# Patient Record
Sex: Male | Born: 1955 | Race: Black or African American | Hispanic: No | Marital: Married | Smoking: Never smoker
Health system: Southern US, Community
[De-identification: ages and names within clinical notes are randomized; demographics above are authoritative.]

## PROBLEM LIST (undated history)

## (undated) DIAGNOSIS — I10 Essential (primary) hypertension: Secondary | ICD-10-CM

## (undated) DIAGNOSIS — M1990 Unspecified osteoarthritis, unspecified site: Secondary | ICD-10-CM

## (undated) DIAGNOSIS — E039 Hypothyroidism, unspecified: Secondary | ICD-10-CM

## (undated) DIAGNOSIS — G4730 Sleep apnea, unspecified: Secondary | ICD-10-CM

## (undated) DIAGNOSIS — E0590 Thyrotoxicosis, unspecified without thyrotoxic crisis or storm: Principal | ICD-10-CM

## (undated) HISTORY — PX: APPENDECTOMY: SHX54

## (undated) HISTORY — DX: Thyrotoxicosis, unspecified without thyrotoxic crisis or storm: E05.90

---

## 1999-02-27 ENCOUNTER — Emergency Department (HOSPITAL_COMMUNITY): Admission: EM | Admit: 1999-02-27 | Discharge: 1999-02-27 | Payer: Self-pay | Admitting: Emergency Medicine

## 2003-11-16 ENCOUNTER — Ambulatory Visit (HOSPITAL_COMMUNITY): Admission: RE | Admit: 2003-11-16 | Discharge: 2003-11-16 | Payer: Self-pay | Admitting: *Deleted

## 2005-01-26 ENCOUNTER — Emergency Department (HOSPITAL_COMMUNITY): Admission: EM | Admit: 2005-01-26 | Discharge: 2005-01-26 | Payer: Self-pay | Admitting: Emergency Medicine

## 2007-12-09 ENCOUNTER — Emergency Department (HOSPITAL_COMMUNITY): Admission: EM | Admit: 2007-12-09 | Discharge: 2007-12-09 | Payer: Self-pay | Admitting: Emergency Medicine

## 2008-10-19 ENCOUNTER — Emergency Department (HOSPITAL_COMMUNITY): Admission: EM | Admit: 2008-10-19 | Discharge: 2008-10-20 | Payer: Self-pay | Admitting: Emergency Medicine

## 2010-09-04 LAB — URINE CULTURE: Colony Count: 3000

## 2010-09-04 LAB — POCT CARDIAC MARKERS
CKMB, poc: <1 ng/mL — ABNORMAL LOW (ref 1.0–8.0)
CKMB, poc: <1 ng/mL — ABNORMAL LOW (ref 1.0–8.0)
Myoglobin, poc: 45.2 ng/mL (ref 12–200)
Myoglobin, poc: 51.2 ng/mL (ref 12–200)
Troponin i, poc: <0.05 ng/mL (ref 0.00–0.09)
Troponin i, poc: <0.05 ng/mL (ref 0.00–0.09)

## 2010-09-04 LAB — URINALYSIS, ROUTINE W REFLEX MICROSCOPIC
Bilirubin Urine: NEGATIVE
Glucose, UA: NEGATIVE mg/dL
Leukocytes, UA: NEGATIVE
Nitrite: NEGATIVE
Protein, ur: NEGATIVE mg/dL
Specific Gravity, Urine: 1.031 — ABNORMAL HIGH (ref 1.005–1.030)
Urobilinogen, UA: 1 mg/dL (ref 0.0–1.0)
pH: 6 (ref 5.0–8.0)

## 2010-09-04 LAB — COMPREHENSIVE METABOLIC PANEL
ALT: 24 U/L (ref 0–53)
AST: 23 U/L (ref 0–37)
Albumin: 3.8 g/dL (ref 3.5–5.2)
Alkaline Phosphatase: 45 U/L (ref 39–117)
BUN: 14 mg/dL (ref 6–23)
CO2: 26 mEq/L (ref 19–32)
Calcium: 8.6 mg/dL (ref 8.4–10.5)
Chloride: 106 mEq/L (ref 96–112)
Creatinine, Ser: 1.1 mg/dL (ref 0.4–1.5)
GFR calc Af Amer: >60 mL/min (ref >60)
GFR calc non Af Amer: >60 mL/min (ref >60)
Glucose, Bld: 100 mg/dL — ABNORMAL HIGH (ref 70–99)
Potassium: 3.3 mEq/L — ABNORMAL LOW (ref 3.5–5.1)
Sodium: 137 mEq/L (ref 135–145)
Total Bilirubin: 1.2 mg/dL (ref 0.3–1.2)
Total Protein: 6.9 g/dL (ref 6.0–8.3)

## 2010-09-04 LAB — DIFFERENTIAL
Basophils Absolute: 0 10*3/uL (ref 0.0–0.1)
Basophils Relative: 0 % (ref 0–1)
Eosinophils Absolute: 0 10*3/uL (ref 0.0–0.7)
Eosinophils Relative: 1 % (ref 0–5)
Lymphocytes Relative: 10 % — ABNORMAL LOW (ref 12–46)
Lymphs Abs: 0.5 10*3/uL — ABNORMAL LOW (ref 0.7–4.0)
Monocytes Absolute: 0.5 10*3/uL (ref 0.1–1.0)
Monocytes Relative: 9 % (ref 3–12)
Neutro Abs: 4.2 10*3/uL (ref 1.7–7.7)
Neutrophils Relative %: 80 % — ABNORMAL HIGH (ref 43–77)

## 2010-09-04 LAB — URINE MICROSCOPIC-ADD ON

## 2010-09-04 LAB — LIPASE, BLOOD: Lipase: 23 U/L (ref 11–59)

## 2010-09-04 LAB — CBC
HCT: 42 % (ref 39.0–52.0)
Hemoglobin: 14.3 g/dL (ref 13.0–17.0)
MCHC: 34 g/dL (ref 30.0–36.0)
MCV: 85.1 fL (ref 78.0–100.0)
Platelets: 161 10*3/uL (ref 150–400)
RBC: 4.94 MIL/uL (ref 4.22–5.81)
RDW: 13.5 % (ref 11.5–15.5)
WBC: 5.2 10*3/uL (ref 4.0–10.5)

## 2012-05-14 ENCOUNTER — Ambulatory Visit: Payer: BC Managed Care – PPO | Attending: Internal Medicine | Admitting: Physical Therapy

## 2012-05-14 DIAGNOSIS — M25619 Stiffness of unspecified shoulder, not elsewhere classified: Secondary | ICD-10-CM | POA: Insufficient documentation

## 2012-05-14 DIAGNOSIS — IMO0001 Reserved for inherently not codable concepts without codable children: Secondary | ICD-10-CM | POA: Insufficient documentation

## 2012-05-14 DIAGNOSIS — R293 Abnormal posture: Secondary | ICD-10-CM | POA: Insufficient documentation

## 2012-05-14 DIAGNOSIS — M25519 Pain in unspecified shoulder: Secondary | ICD-10-CM | POA: Insufficient documentation

## 2012-05-29 ENCOUNTER — Encounter: Payer: BC Managed Care – PPO | Admitting: Rehabilitation

## 2012-06-12 ENCOUNTER — Ambulatory Visit: Payer: BC Managed Care – PPO | Attending: Internal Medicine | Admitting: Rehabilitation

## 2012-06-12 DIAGNOSIS — R293 Abnormal posture: Secondary | ICD-10-CM | POA: Insufficient documentation

## 2012-06-12 DIAGNOSIS — M25619 Stiffness of unspecified shoulder, not elsewhere classified: Secondary | ICD-10-CM | POA: Insufficient documentation

## 2012-06-12 DIAGNOSIS — M25519 Pain in unspecified shoulder: Secondary | ICD-10-CM | POA: Insufficient documentation

## 2012-06-12 DIAGNOSIS — IMO0001 Reserved for inherently not codable concepts without codable children: Secondary | ICD-10-CM | POA: Insufficient documentation

## 2012-06-15 ENCOUNTER — Ambulatory Visit: Payer: BC Managed Care – PPO | Admitting: Physical Therapy

## 2012-06-22 ENCOUNTER — Encounter: Payer: BC Managed Care – PPO | Admitting: Physical Therapy

## 2012-06-22 ENCOUNTER — Ambulatory Visit: Payer: BC Managed Care – PPO | Admitting: Physical Therapy

## 2012-06-24 ENCOUNTER — Ambulatory Visit: Payer: BC Managed Care – PPO | Admitting: Physical Therapy

## 2012-06-29 ENCOUNTER — Ambulatory Visit: Payer: BC Managed Care – PPO | Attending: Internal Medicine | Admitting: Physical Therapy

## 2012-06-29 DIAGNOSIS — R293 Abnormal posture: Secondary | ICD-10-CM | POA: Insufficient documentation

## 2012-06-29 DIAGNOSIS — M25519 Pain in unspecified shoulder: Secondary | ICD-10-CM | POA: Insufficient documentation

## 2012-06-29 DIAGNOSIS — M25619 Stiffness of unspecified shoulder, not elsewhere classified: Secondary | ICD-10-CM | POA: Insufficient documentation

## 2012-06-29 DIAGNOSIS — IMO0001 Reserved for inherently not codable concepts without codable children: Secondary | ICD-10-CM | POA: Insufficient documentation

## 2012-07-03 ENCOUNTER — Ambulatory Visit: Payer: BC Managed Care – PPO

## 2012-07-06 ENCOUNTER — Ambulatory Visit: Payer: BC Managed Care – PPO | Admitting: Physical Therapy

## 2012-07-10 ENCOUNTER — Ambulatory Visit: Payer: BC Managed Care – PPO

## 2012-07-14 ENCOUNTER — Ambulatory Visit: Payer: BC Managed Care – PPO | Admitting: Physical Therapy

## 2012-07-16 ENCOUNTER — Encounter: Payer: BC Managed Care – PPO | Admitting: Physical Therapy

## 2012-07-20 ENCOUNTER — Encounter: Payer: BC Managed Care – PPO | Admitting: Physical Therapy

## 2012-07-23 ENCOUNTER — Encounter: Payer: BC Managed Care – PPO | Admitting: Physical Therapy

## 2013-07-23 ENCOUNTER — Ambulatory Visit (INDEPENDENT_AMBULATORY_CARE_PROVIDER_SITE_OTHER): Payer: BC Managed Care – PPO | Admitting: Physician Assistant

## 2013-07-23 VITALS — BP 124/78 | HR 91 | Temp 98.4°F | Resp 16 | Ht 68.0 in | Wt 213.6 lb

## 2013-07-23 DIAGNOSIS — B9789 Other viral agents as the cause of diseases classified elsewhere: Secondary | ICD-10-CM

## 2013-07-23 DIAGNOSIS — Z202 Contact with and (suspected) exposure to infections with a predominantly sexual mode of transmission: Secondary | ICD-10-CM

## 2013-07-23 DIAGNOSIS — J069 Acute upper respiratory infection, unspecified: Secondary | ICD-10-CM

## 2013-07-23 DIAGNOSIS — Z113 Encounter for screening for infections with a predominantly sexual mode of transmission: Secondary | ICD-10-CM

## 2013-07-23 LAB — POCT URINALYSIS DIPSTICK
Bilirubin, UA: NEGATIVE
Blood, UA: NEGATIVE
Glucose, UA: NEGATIVE
Ketones, UA: NEGATIVE
Leukocytes, UA: NEGATIVE
Nitrite, UA: NEGATIVE
Spec Grav, UA: 1.02
Urobilinogen, UA: 1
pH, UA: 6.5

## 2013-07-23 LAB — POCT UA - MICROSCOPIC ONLY
Bacteria, U Microscopic: NEGATIVE
Casts, Ur, LPF, POC: NEGATIVE
Crystals, Ur, HPF, POC: NEGATIVE
Epithelial cells, urine per micros: NEGATIVE
Mucus, UA: NEGATIVE
Yeast, UA: NEGATIVE

## 2013-07-23 MED ORDER — IPRATROPIUM BROMIDE 0.03 % NA SOLN: 2.0000 | Freq: Two times a day (BID) | NASAL | Status: DC

## 2013-07-23 MED ORDER — MUCINEX DM MAXIMUM STRENGTH 60-1200 MG PO TB12: 1.0000 | ORAL_TABLET | Freq: Two times a day (BID) | ORAL | Status: DC

## 2013-07-23 MED ORDER — METRONIDAZOLE 500 MG PO TABS: 2000.0000 mg | ORAL_TABLET | Freq: Once | ORAL | Status: DC

## 2013-07-23 NOTE — Progress Notes (Signed)
I have examined this patient along with the student and agree.  

## 2013-07-23 NOTE — Patient Instructions (Signed)
I will contact you with your lab results as soon as they are available.   If you have not heard from me in 2 weeks, please contact me.  The fastest way to get your results is to register for My Chart (see the instructions on the last page of this printout).   

## 2013-07-23 NOTE — Progress Notes (Signed)
Subjective:    Patient ID: Jordan DavidsonVernon Barajas, male    DOB: 1955-10-29, 58 y.o.   MRN: [id number]  HPI Patient present with a 2 day history of cough productive of green/yellow sputum.  He reports fatigue, sinus congestion, thin clear rhinorrhea, and frontal sinus pressure/pain.  He denies fever, chills, sweats, sore throat, post nasal drip, sneezing, shortness of breath, or wheezing.    He has been able to sleep without difficulty but his wife reports increased snoring.    Patient reports his wife was diagnosed with trichomoniasis 2 days ago during evaluation for abdominal pain and he needs to be treated.  He denies any urinary symptoms, penial discharge, penial/scrotum/groin sores or tenderness.  He reports having another sexual partner in addition to his wife.  Review of Systems  Constitutional: Positive for fatigue. Negative for fever, chills and diaphoresis.  HENT: Positive for congestion, rhinorrhea (thin and clear) and sinus pressure (frontal). Negative for ear pain, postnasal drip, sneezing and sore throat.   Respiratory: Positive for cough. Negative for shortness of breath and wheezing.   Genitourinary: Negative for dysuria, urgency, frequency, flank pain, discharge, penile swelling, scrotal swelling, difficulty urinating, genital sores, penile pain and testicular pain.      Objective:   Physical Exam  Constitutional: He is oriented to person, place, and time. He appears well-developed and well-nourished. No distress.  HENT:  Head: Normocephalic and atraumatic.  Right Ear: Tympanic membrane normal.  Left Ear: Tympanic membrane normal.  Nose: Mucosal edema and rhinorrhea present. Right sinus exhibits frontal sinus tenderness. Right sinus exhibits no maxillary sinus tenderness. Left sinus exhibits frontal sinus tenderness. Left sinus exhibits no maxillary sinus tenderness.  Mouth/Throat: Oropharynx is clear and moist.  Eyes: Pupils are equal, round, and reactive to light. Right eye  exhibits no discharge. Left eye exhibits no discharge. Right conjunctiva is not injected. Left conjunctiva is not injected. No scleral icterus.  Neck: Normal range of motion. Neck supple.  Cardiovascular: Normal rate, regular rhythm and normal heart sounds.   Pulmonary/Chest: Effort normal and breath sounds normal. No respiratory distress. He has no wheezes. He has no rhonchi. He has no rales.  Lymphadenopathy:       Head (right side): No submandibular, no tonsillar, no preauricular, no posterior auricular and no occipital adenopathy present.       Head (left side): No submandibular, no tonsillar, no preauricular, no posterior auricular and no occipital adenopathy present.  Neurological: He is alert and oriented to person, place, and time.  Skin: Skin is warm and dry.  Psychiatric: He has a normal mood and affect. His behavior is normal. Judgment and thought content normal.      Results for orders placed in visit on 07/23/13  POCT UA - MICROSCOPIC ONLY      Result Value Ref Range   WBC, Ur, HPF, POC 0-1     RBC, urine, microscopic 3-6     Bacteria, U Microscopic neg     Mucus, UA neg     Epithelial cells, urine per micros neg     Crystals, Ur, HPF, POC neg     Casts, Ur, LPF, POC neg     Yeast, UA neg    POCT URINALYSIS DIPSTICK      Result Value Ref Range   Color, UA amber     Clarity, UA clear     Glucose, UA neg     Bilirubin, UA neg     Ketones, UA neg     Spec  Grav, UA 1.020     Blood, UA neg     pH, UA 6.5     Protein, UA trace     Urobilinogen, UA 1.0     Nitrite, UA neg     Leukocytes, UA Negative      Assessment & Plan:   1. Exposure to trichomonas Asymptomatic, UA negative, will treat due to contact - POCT UA - Microscopic Only - POCT urinalysis dipstick - metroNIDAZOLE (FLAGYL) 500 MG tablet; Take 4 tablets (2,000 mg total) by mouth once.  Dispense: 4 tablet; Refill: 0  2. Routine screening for STI (sexually transmitted infection) Asymptomatic, will screen  for additional STIs.  Counseled on safer sex practices and encouraged him to discuss with his wife who is unaware of his extramarital partner. - GC/Chlamydia Probe Amp - Hepatitis B surface antibody - Hepatitis B surface antigen - Hepatitis C antibody - HIV antibody - HSV(herpes simplex vrs) 1+2 ab-IgG - RPR  3. Viral URI with cough Symptomatic treatment - Dextromethorphan-Guaifenesin (MUCINEX DM MAXIMUM STRENGTH) 60-1200 MG TB12; Take 1 tablet by mouth every 12 (twelve) hours.  Dispense: 14 each; Refill: 1 - ipratropium (ATROVENT) 0.03 % nasal spray; Place 2 sprays into both nostrils 2 (two) times daily.  Dispense: 30 mL; Refill: 0

## 2013-07-24 LAB — HEPATITIS C ANTIBODY: HCV Ab: NEGATIVE

## 2013-07-24 LAB — HEPATITIS B SURFACE ANTIGEN: Hepatitis B Surface Ag: NEGATIVE

## 2013-07-24 LAB — HEPATITIS B SURFACE ANTIBODY, QUANTITATIVE: Hepatitis B-Post: 0 m[IU]/mL

## 2013-07-24 LAB — RPR

## 2013-07-24 LAB — HIV ANTIBODY (ROUTINE TESTING W REFLEX): HIV: NONREACTIVE

## 2013-07-26 ENCOUNTER — Telehealth: Payer: Self-pay | Admitting: Physician Assistant

## 2013-07-26 DIAGNOSIS — R7689 Other specified abnormal immunological findings in serum: Secondary | ICD-10-CM

## 2013-07-26 DIAGNOSIS — R768 Other specified abnormal immunological findings in serum: Secondary | ICD-10-CM

## 2013-07-26 DIAGNOSIS — Z23 Encounter for immunization: Secondary | ICD-10-CM

## 2013-07-26 LAB — HSV(HERPES SIMPLEX VRS) I + II AB-IGG
HSV 1 Glycoprotein G Ab, IgG: 1.45 IV — ABNORMAL HIGH
HSV 2 Glycoprotein G Ab, IgG: 7.61 IV — ABNORMAL HIGH

## 2013-07-26 LAB — GC/CHLAMYDIA PROBE AMP
CT Probe RNA: NEGATIVE
GC Probe RNA: NEGATIVE

## 2013-07-26 MED ORDER — VALACYCLOVIR HCL 500 MG PO TABS: 500.0000 mg | ORAL_TABLET | Freq: Every day | ORAL | Status: DC

## 2013-07-26 NOTE — Telephone Encounter (Signed)
Spoke with patient. Advised of test results. Advised that his wife get tested for HSV as well.  If she's negative, he should take daily suppressive therapy. Also advised that he receive Hep B vaccine series.  Meds ordered this encounter  Medications  . valACYclovir (VALTREX) 500 MG tablet    Sig: Take 1 tablet (500 mg total) by mouth daily.    Dispense:  90 tablet    Refill:  4    Order Specific Question:  Supervising Provider    Answer:  Merla RichesOLITTLE, ROBERT P [[id number]]   Orders Placed This Encounter  Procedures  . Hepatitis B vaccine adult IM    Give at 0, 1 and 6 months    Standing Status: Standing     Number of Occurrences: 3     Standing Expiration Date: 07/27/2014

## 2015-09-12 DIAGNOSIS — Z1211 Encounter for screening for malignant neoplasm of colon: Secondary | ICD-10-CM | POA: Diagnosis not present

## 2015-10-04 DIAGNOSIS — Z01818 Encounter for other preprocedural examination: Secondary | ICD-10-CM | POA: Diagnosis not present

## 2015-10-04 DIAGNOSIS — K635 Polyp of colon: Secondary | ICD-10-CM | POA: Diagnosis not present

## 2015-10-04 DIAGNOSIS — Z1211 Encounter for screening for malignant neoplasm of colon: Secondary | ICD-10-CM | POA: Diagnosis not present

## 2015-10-05 DIAGNOSIS — K635 Polyp of colon: Secondary | ICD-10-CM | POA: Diagnosis not present

## 2015-10-20 DIAGNOSIS — Z1211 Encounter for screening for malignant neoplasm of colon: Secondary | ICD-10-CM | POA: Diagnosis not present

## 2015-12-12 DIAGNOSIS — R634 Abnormal weight loss: Secondary | ICD-10-CM | POA: Diagnosis not present

## 2015-12-12 DIAGNOSIS — R5383 Other fatigue: Secondary | ICD-10-CM | POA: Diagnosis not present

## 2015-12-12 DIAGNOSIS — E0590 Thyrotoxicosis, unspecified without thyrotoxic crisis or storm: Secondary | ICD-10-CM | POA: Diagnosis not present

## 2016-01-30 DIAGNOSIS — E0590 Thyrotoxicosis, unspecified without thyrotoxic crisis or storm: Secondary | ICD-10-CM | POA: Diagnosis not present

## 2016-02-01 ENCOUNTER — Telehealth: Payer: Self-pay | Admitting: Adult Health

## 2016-02-01 ENCOUNTER — Encounter: Payer: Self-pay | Admitting: Endocrinology

## 2016-02-01 ENCOUNTER — Ambulatory Visit (INDEPENDENT_AMBULATORY_CARE_PROVIDER_SITE_OTHER): Payer: Self-pay | Admitting: Endocrinology

## 2016-02-01 DIAGNOSIS — E0590 Thyrotoxicosis, unspecified without thyrotoxic crisis or storm: Secondary | ICD-10-CM

## 2016-02-01 HISTORY — DX: Thyrotoxicosis, unspecified without thyrotoxic crisis or storm: E05.90

## 2016-02-01 NOTE — Patient Instructions (Signed)
Please stop taking the methimazole let's check a thyroid "scan" (a special, but easy and painless type of thyroid x ray).  It works like this: you go to the x-ray department of the hospital to swallow a pill, which contains a miniscule amount of radiation.  You will not notice any symptoms from this.  You will go back to the x-ray department the next day, to lie down in front of a camera.  The results of this will be sent to me.   Based on the results, i hope to order for you a treatment pill of radioactive iodine.  Although it is a larger amount of radiation, you will again notice no symptoms from this.  The pill is gone from your body in a few days (during which you should stay away from other people), but takes several months to work.  Therefore, please return here approximately 6-8 weeks after the treatment.  This treatment has been available for many years, and the only known side-effect is an underactive thyroid.  It is possible that i would eventually prescribe for you a thyroid hormone pill, which is very inexpensive.  You don't have to worry about side-effects of this thyroid hormone pill, because it is the same molecule your thyroid makes.   

## 2016-02-01 NOTE — Progress Notes (Signed)
   Subjective:    Patient ID: Jordan Barajas, male    DOB: 07/01/1955, 60 y.o.   MRN: [id number]  HPI Pt reports a few weeks of slight tremor of the hands, and assoc weight loss (15 lbs). He has never had XRT to the anterior neck, or thyroid surgery.  He has never had thyroid imaging.  He does not consume kelp or any other prescribed or non-prescribed thyroid medication.  He has never been on amiodarone.   Past Medical History:  Diagnosis Date  . Hyperthyroidism 02/01/2016    No past surgical history on file.  Social History   Social History  . Marital status: Married    Spouse name: N/A  . Number of children: N/A  . Years of education: N/A   Occupational History  . Not on file.   Social History Main Topics  . Smoking status: Never Smoker  . Smokeless tobacco: Not on file  . Alcohol use No  . Drug use: No  . Sexual activity: Not on file   Other Topics Concern  . Not on file   Social History Narrative  . No narrative on file    No current outpatient prescriptions on file prior to visit.   No current facility-administered medications on file prior to visit.     No Known Allergies  Family History  Problem Relation Age of Onset  . Thyroid disease Neg Hx    BP 132/80   Pulse 79   Ht 5' 9.5" (1.765 m)   Wt 195 lb (88.5 kg)   SpO2 97%   BMI 28.38 kg/m   Review of Systems denies fever, headache, hoarseness, diplopia, palpitations, sob, diarrhea, polyuria, muscle weakness, excessive diaphoresis, seizure, anxiety, heat intolerance, easy bruising, and rhinorrhea.  He has fatigue.      Objective:   Physical Exam VS: see vs page GEN: no distress HEAD: head: no deformity.  eyes: no periorbital swelling, no proptosis.   external nose and ears are normal.   mouth: no lesion seen NECK: supple, thyroid is not enlarged CHEST WALL: no deformity.  LUNGS: clear to auscultation.  CV: reg rate and rhythm, no murmur.  ABD: abdomen is soft, nontender.  no hepatosplenomegaly.   not distended.  no hernia MUSCULOSKELETAL: muscle bulk and strength are grossly normal.  no obvious joint swelling.  gait is normal and steady EXTEMITIES: no deformity.  no edema PULSES: no carotid bruit NEURO:  cn 2-12 grossly intact.   readily moves all 4's.  sensation is intact to touch on all 4's  No tremor SKIN:  Normal texture and temperature.  No rash or suspicious lesion is visible.  Not diaphoretic NODES:  None palpable at the neck.   PSYCH: alert, well-oriented.  Does not appear anxious nor depressed.    outside test results are reviewed:  TSH=0.10 Free T4=1.34  I have reviewed outside records, and summarized: Pt was seen in 2015 for URI, but no thyroid problem was noted then.   He was seen a few weeks ago by Dr Donette LarryHusain, and he then had hyperthyroidism     Assessment & Plan:  Hyperthyroidism, new, uncertain etiology. We discussed rx options.  He chooses RAI.

## 2016-02-01 NOTE — Telephone Encounter (Signed)
error 

## 2016-02-12 ENCOUNTER — Encounter (HOSPITAL_COMMUNITY)
Admission: RE | Admit: 2016-02-12 | Discharge: 2016-02-12 | Disposition: A | Discharge status: Home / Self Care | Payer: BLUE CROSS/BLUE SHIELD | Source: Ambulatory Visit | Attending: Endocrinology | Admitting: Endocrinology

## 2016-02-12 DIAGNOSIS — E0590 Thyrotoxicosis, unspecified without thyrotoxic crisis or storm: Secondary | ICD-10-CM | POA: Diagnosis not present

## 2016-02-12 MED ORDER — SODIUM IODIDE I 131 CAPSULE: 8.3000 | Freq: Once | INTRAVENOUS | Status: DC | PRN

## 2016-02-13 ENCOUNTER — Encounter (HOSPITAL_COMMUNITY)
Admission: RE | Admit: 2016-02-13 | Discharge: 2016-02-13 | Disposition: A | Discharge status: Home / Self Care | Payer: BLUE CROSS/BLUE SHIELD | Source: Ambulatory Visit | Attending: Endocrinology | Admitting: Endocrinology

## 2016-02-13 ENCOUNTER — Other Ambulatory Visit: Payer: Self-pay | Admitting: Endocrinology

## 2016-02-13 DIAGNOSIS — E0590 Thyrotoxicosis, unspecified without thyrotoxic crisis or storm: Secondary | ICD-10-CM | POA: Diagnosis not present

## 2016-02-13 MED ORDER — SODIUM PERTECHNETATE TC 99M INJECTION
10.0000 | Freq: Once | INTRAVENOUS | Status: AC | PRN
  Administered 2016-02-13: 10.04 via INTRAVENOUS

## 2016-02-14 ENCOUNTER — Telehealth: Payer: Self-pay | Admitting: Endocrinology

## 2016-02-14 ENCOUNTER — Encounter: Payer: Self-pay | Admitting: Endocrinology

## 2016-02-14 NOTE — Telephone Encounter (Signed)
done

## 2016-02-14 NOTE — Telephone Encounter (Signed)
I contacted the patient. He has been scheduled for his RAI treatment 10/2-10/4 Patient is requesting a letter stating he is having this procedure done and will need to be out of work.  Please advise, Thanks!

## 2016-02-14 NOTE — Telephone Encounter (Signed)
Patient is returning your call.  

## 2016-02-14 NOTE — Telephone Encounter (Signed)
I contacted the patient and advised the letter is ready for pick up. Letter placed up front for the patient to come by and pick up. Patient voiced understanding.

## 2016-02-15 NOTE — Telephone Encounter (Signed)
Patient is asking how long do he have to stay away for his family while taking the radiation treatment, please advise

## 2016-02-16 NOTE — Telephone Encounter (Signed)
See message. Should the patient be in isolation three days after the RAI treatment?

## 2016-02-16 NOTE — Telephone Encounter (Signed)
Yes, you should stay off work that day, and 3 more.

## 2016-02-16 NOTE — Telephone Encounter (Signed)
I contacted the patient and advised of Md's instructions. Patient advised to call back if he had any further questions.

## 2016-02-26 ENCOUNTER — Encounter (HOSPITAL_COMMUNITY)
Admission: RE | Admit: 2016-02-26 | Discharge: 2016-02-26 | Disposition: A | Discharge status: Home / Self Care | Payer: BLUE CROSS/BLUE SHIELD | Source: Ambulatory Visit | Attending: Endocrinology | Admitting: Endocrinology

## 2016-02-26 DIAGNOSIS — E0500 Thyrotoxicosis with diffuse goiter without thyrotoxic crisis or storm: Secondary | ICD-10-CM | POA: Diagnosis not present

## 2016-02-26 DIAGNOSIS — E0590 Thyrotoxicosis, unspecified without thyrotoxic crisis or storm: Secondary | ICD-10-CM | POA: Insufficient documentation

## 2016-02-26 MED ORDER — SODIUM IODIDE I 131 CAPSULE
13.0000 | Freq: Once | INTRAVENOUS | Status: AC | PRN
  Administered 2016-02-26: 13 via ORAL

## 2016-04-07 NOTE — Progress Notes (Deleted)
   Subjective:    Patient ID: Jordan Barajas, male    DOB: 10-18-1955, 60 y.o.   MRN: [id number]  HPI Pt returns for f/u of hyperthyroidism (due to Grave's Dz; dx'ed 2017; he had RAI in Oct of 2017)   Review of Systems     Objective:   Physical Exam VITAL SIGNS:  See vs page GENERAL: no distress Pulses: dorsalis pedis intact bilat.   MSK: no deformity of the feet CV: no leg edema Skin:  no ulcer on the feet.  normal color and temp on the feet. Neuro: sensation is intact to touch on the feet        Assessment & Plan:

## 2016-04-09 ENCOUNTER — Ambulatory Visit: Payer: BLUE CROSS/BLUE SHIELD | Admitting: Endocrinology

## 2016-04-09 ENCOUNTER — Encounter: Payer: Self-pay | Admitting: Endocrinology

## 2016-04-09 ENCOUNTER — Ambulatory Visit (INDEPENDENT_AMBULATORY_CARE_PROVIDER_SITE_OTHER): Payer: BLUE CROSS/BLUE SHIELD | Admitting: Endocrinology

## 2016-04-09 VITALS — BP 142/88 | HR 68 | Ht 69.5 in | Wt 202.0 lb

## 2016-04-09 DIAGNOSIS — E0590 Thyrotoxicosis, unspecified without thyrotoxic crisis or storm: Secondary | ICD-10-CM

## 2016-04-09 LAB — TSH: TSH: 0.58 u[IU]/mL (ref 0.35–4.50)

## 2016-04-09 LAB — T4, FREE: Free T4: 0.32 ng/dL — ABNORMAL LOW (ref 0.60–1.60)

## 2016-04-09 NOTE — Progress Notes (Signed)
   Subjective:    Patient ID: Jordan Barajas, male    DOB: February 10, 1956, 60 y.o.   MRN: [id number]  HPI Pt returns for f/u of hyperthyroidism (due to Grave's disease; dx'ed 2017; he had RAI in Oct of 2017).   Since then, he feels much better.   Past Medical History:  Diagnosis Date  . Hyperthyroidism 02/01/2016    No past surgical history on file.  Social History   Social History  . Marital status: Married    Spouse name: N/A  . Number of children: N/A  . Years of education: N/A   Occupational History  . Not on file.   Social History Main Topics  . Smoking status: Never Smoker  . Smokeless tobacco: Not on file  . Alcohol use No  . Drug use: No  . Sexual activity: Not on file   Other Topics Concern  . Not on file   Social History Narrative  . No narrative on file    No current outpatient prescriptions on file prior to visit.   No current facility-administered medications on file prior to visit.     No Known Allergies  Family History  Problem Relation Age of Onset  . Thyroid disease Neg Hx     BP (!) 142/88   Pulse 68   Ht 5' 9.5" (1.765 m)   Wt 202 lb (91.6 kg)   SpO2 98%   BMI 29.40 kg/m    Review of Systems Denies tremor.      Objective:   Physical Exam VITAL SIGNS:  See vs page GENERAL: no distress NECK: There is no palpable thyroid enlargement.  No thyroid nodule is palpable.  No palpable lymphadenopathy at the anterior neck.  Lab Results  Component Value Date   TSH 0.58 04/09/2016      Assessment & Plan:  Hyperthyroidism, improved with RAI.  recheck the blood tests in a few weeks.

## 2016-04-09 NOTE — Patient Instructions (Addendum)
blood tests are requested for you today.  We'll let you know about the results.  Based on the results, I may say you should come back to the lab in 3-4 weeks.   Please come back for a follow-up appointment in 2-3 months.

## 2016-05-02 ENCOUNTER — Other Ambulatory Visit: Payer: Self-pay | Admitting: Endocrinology

## 2016-05-02 ENCOUNTER — Other Ambulatory Visit (INDEPENDENT_AMBULATORY_CARE_PROVIDER_SITE_OTHER): Payer: BLUE CROSS/BLUE SHIELD

## 2016-05-02 DIAGNOSIS — E0590 Thyrotoxicosis, unspecified without thyrotoxic crisis or storm: Secondary | ICD-10-CM

## 2016-05-02 LAB — TSH: TSH: 25.1 u[IU]/mL — ABNORMAL HIGH (ref 0.35–4.50)

## 2016-05-02 LAB — T4, FREE: Free T4: 0.07 ng/dL — ABNORMAL LOW (ref 0.60–1.60)

## 2016-05-02 MED ORDER — LEVOTHYROXINE SODIUM 100 MCG PO TABS: 100.0000 ug | ORAL_TABLET | Freq: Every day | ORAL | 2 refills | Status: DC

## 2016-05-03 ENCOUNTER — Telehealth: Payer: Self-pay | Admitting: Endocrinology

## 2016-05-03 NOTE — Telephone Encounter (Signed)
Patient is returning your call.  

## 2016-06-07 DIAGNOSIS — Z23 Encounter for immunization: Secondary | ICD-10-CM | POA: Diagnosis not present

## 2016-06-07 DIAGNOSIS — Z1389 Encounter for screening for other disorder: Secondary | ICD-10-CM | POA: Diagnosis not present

## 2016-06-07 DIAGNOSIS — Z1159 Encounter for screening for other viral diseases: Secondary | ICD-10-CM | POA: Diagnosis not present

## 2016-06-07 DIAGNOSIS — Z0000 Encounter for general adult medical examination without abnormal findings: Secondary | ICD-10-CM | POA: Diagnosis not present

## 2016-06-07 DIAGNOSIS — E039 Hypothyroidism, unspecified: Secondary | ICD-10-CM | POA: Diagnosis not present

## 2016-06-07 DIAGNOSIS — R972 Elevated prostate specific antigen [PSA]: Secondary | ICD-10-CM | POA: Diagnosis not present

## 2016-06-12 ENCOUNTER — Ambulatory Visit: Payer: BLUE CROSS/BLUE SHIELD | Admitting: Endocrinology

## 2016-06-20 ENCOUNTER — Encounter: Payer: Self-pay | Admitting: Endocrinology

## 2016-06-20 ENCOUNTER — Ambulatory Visit (INDEPENDENT_AMBULATORY_CARE_PROVIDER_SITE_OTHER): Payer: BLUE CROSS/BLUE SHIELD | Admitting: Endocrinology

## 2016-06-20 VITALS — BP 136/86 | HR 65 | Ht 69.5 in | Wt 209.0 lb

## 2016-06-20 DIAGNOSIS — E0590 Thyrotoxicosis, unspecified without thyrotoxic crisis or storm: Secondary | ICD-10-CM

## 2016-06-20 LAB — TSH: TSH: 20.21 u[IU]/mL — ABNORMAL HIGH (ref 0.35–4.50)

## 2016-06-20 MED ORDER — LEVOTHYROXINE SODIUM 150 MCG PO TABS: 150.0000 ug | ORAL_TABLET | Freq: Every day | ORAL | 11 refills | Status: DC

## 2016-06-20 NOTE — Patient Instructions (Addendum)
Another thyroid blood test is requested for you today.  We'll let you know about the results.  Dr Eula ListenHussain would be happy to adjust the medication for you.  You will probably need this medication for the rest of your life.   I would be happy to see you back here as needed.

## 2016-06-20 NOTE — Progress Notes (Signed)
   Subjective:    Patient ID: Jordan Barajas, male    DOB: September 19, 1955, 61 y.o.   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   Since then, he feels much better.  2 weeks ago, synthroid was increased to 112 mcg/d.   Past Medical History:  Diagnosis Date  . Hyperthyroidism 02/01/2016    No past surgical history on file.  Social History   Social History  . Marital status: Married    Spouse name: N/A  . Number of children: N/A  . Years of education: N/A   Occupational History  . Not on file.   Social History Main Topics  . Smoking status: Never Smoker  . Smokeless tobacco: Never Used  . Alcohol use No  . Drug use: No  . Sexual activity: Not on file   Other Topics Concern  . Not on file   Social History Narrative  . No narrative on file    No current outpatient prescriptions on file prior to visit.   No current facility-administered medications on file prior to visit.     No Known Allergies  Family History  Problem Relation Age of Onset  . Thyroid disease Neg Hx     BP 136/86   Pulse 65   Ht 5' 9.5" (1.765 m)   Wt 209 lb (94.8 kg)   SpO2 97%   BMI 30.42 kg/m    Review of Systems Denies edema.     Objective:   Physical Exam VITAL SIGNS:  See vs page GENERAL: no distress NECK: There is no palpable thyroid enlargement.  No thyroid nodule is palpable.  No palpable lymphadenopathy at the anterior neck.  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  I have sent a prescription to your pharmacy.  Come back to out lab to recheck TSH in 30d

## 2016-07-23 ENCOUNTER — Other Ambulatory Visit (INDEPENDENT_AMBULATORY_CARE_PROVIDER_SITE_OTHER): Payer: BLUE CROSS/BLUE SHIELD

## 2016-07-23 DIAGNOSIS — E0590 Thyrotoxicosis, unspecified without thyrotoxic crisis or storm: Secondary | ICD-10-CM | POA: Diagnosis not present

## 2016-07-23 LAB — TSH: TSH: 15.13 u[IU]/mL — ABNORMAL HIGH (ref 0.35–4.50)

## 2016-08-26 ENCOUNTER — Other Ambulatory Visit: Payer: Self-pay | Admitting: Endocrinology

## 2017-04-11 ENCOUNTER — Other Ambulatory Visit: Payer: Self-pay | Admitting: Endocrinology

## 2017-04-28 ENCOUNTER — Other Ambulatory Visit: Payer: Self-pay | Admitting: Internal Medicine

## 2017-04-28 ENCOUNTER — Ambulatory Visit
Admission: RE | Admit: 2017-04-28 | Discharge: 2017-04-28 | Disposition: A | Discharge status: Home / Self Care | Payer: BLUE CROSS/BLUE SHIELD | Source: Ambulatory Visit | Attending: Internal Medicine | Admitting: Internal Medicine

## 2017-04-28 DIAGNOSIS — M25562 Pain in left knee: Secondary | ICD-10-CM

## 2017-04-28 DIAGNOSIS — M179 Osteoarthritis of knee, unspecified: Secondary | ICD-10-CM | POA: Diagnosis not present

## 2017-04-29 ENCOUNTER — Encounter (INDEPENDENT_AMBULATORY_CARE_PROVIDER_SITE_OTHER): Payer: Self-pay | Admitting: Orthopaedic Surgery

## 2017-04-29 ENCOUNTER — Ambulatory Visit (INDEPENDENT_AMBULATORY_CARE_PROVIDER_SITE_OTHER): Payer: BLUE CROSS/BLUE SHIELD | Admitting: Orthopaedic Surgery

## 2017-04-29 VITALS — Ht 69.0 in | Wt 218.0 lb

## 2017-04-29 DIAGNOSIS — M1712 Unilateral primary osteoarthritis, left knee: Secondary | ICD-10-CM | POA: Diagnosis not present

## 2017-04-29 MED ORDER — BUPIVACAINE HCL 0.5 % IJ SOLN
2.0000 mL | INTRAMUSCULAR | Status: AC | PRN
  Administered 2017-04-29: 2 mL via INTRA_ARTICULAR

## 2017-04-29 MED ORDER — LIDOCAINE HCL 1 % IJ SOLN
2.0000 mL | INTRAMUSCULAR | Status: AC | PRN
  Administered 2017-04-29: 2 mL

## 2017-04-29 MED ORDER — METHYLPREDNISOLONE ACETATE 40 MG/ML IJ SUSP
40.0000 mg | INTRAMUSCULAR | Status: AC | PRN
  Administered 2017-04-29: 40 mg via INTRA_ARTICULAR

## 2017-04-29 NOTE — Addendum Note (Signed)
Addended by: Penne LashSHUE WILLS, Neysa BonitoHRISTY N on: 04/29/2017 11:10 AM   Modules accepted: Orders

## 2017-04-29 NOTE — Progress Notes (Signed)
Office Visit Note   Patient: Jordan Barajas           Date of Birth: August 26, 1955           MRN: [id number] Visit Date: 04/29/2017              Requested by: Georgann HousekeeperHusain, Karrar, MD [address] [place], [place] [address] PCP: Georgann HousekeeperHusain, Karrar, MD   Assessment & Plan: Visit Diagnoses:  1. Unilateral primary osteoarthritis, left knee     Plan: Impression is advanced degenerative joint disease with acute exacerbation.  25 cc of joint fluid was aspirated and then injected with cortisone.  Patient tolerated well.  His x-rays were reviewed with the patient does show advanced degenerative joint disease.  Overall he is getting around okay and tolerating his knee arthritis well so he does not really have significant declining quality of life currently.  Follow-Up Instructions: Return if symptoms worsen or fail to improve.   Orders:  No orders of the defined types were placed in this encounter.  No orders of the defined types were placed in this encounter.     Procedures: Large Joint Inj: L knee on 04/29/2017 11:08 AM Details: 22 G needle Medications: 2 mL bupivacaine 0.5 %; 2 mL lidocaine 1 %; 40 mg methylPREDNISolone acetate 40 MG/ML Aspirate: 25 mL yellow; sent for lab analysis Outcome: tolerated well, no immediate complications Patient was prepped and draped in the usual sterile fashion.       Clinical Data: No additional findings.   Subjective: Chief Complaint  Patient presents with  . Left Knee - Pain    Patient is a 61 year old gentleman with acute exacerbation of his left knee pain and swelling over the last weekend.  He has chronic arthritic pain but recently traveled to [facility].  He denies any numbness or tingling or injuries.  No radiation of pain.    Review of Systems  Constitutional: Negative.   All other systems reviewed and are negative.    Objective: Vital Signs: Ht 5\' 9"  (1.753 m)   Wt 218 lb (98.9 kg)   BMI 32.19 kg/m   Physical Exam    Constitutional: He is oriented to person, place, and time. He appears well-developed and well-nourished.  HENT:  Head: Normocephalic and atraumatic.  Eyes: Pupils are equal, round, and reactive to light.  Neck: Neck supple.  Pulmonary/Chest: Effort normal.  Abdominal: Soft.  Musculoskeletal: Normal range of motion.  Neurological: He is alert and oriented to person, place, and time.  Skin: Skin is warm.  Psychiatric: He has a normal mood and affect. His behavior is normal. Judgment and thought content normal.  Nursing note and vitals reviewed.   Ortho Exam Left knee exam shows a moderate joint effusion.  No signs of infection.  Collaterals and cruciates are stable. Specialty Comments:  No specialty comments available.  Imaging: Dg Knee 1-2 Views Left  Result Date: 04/28/2017 CLINICAL DATA:  Chronic left knee pain for the past 6 months with no known injury. EXAM: LEFT KNEE - 1-2 VIEW COMPARISON:  None in PACs FINDINGS: The bones are subjectively adequately mineralized. There is moderate narrowing of the medial joint space. There is milder narrowing laterally. Spurs arise from the articular margins of the tibial plateaus and the medial femoral condyle there is also spurring of the articular margins of the patella. There is no acute fracture or dislocation. IMPRESSION: Tricompartmental osteoarthritic change greatest involving the medial compartment. No acute bony abnormality. Electronically Signed  By: David  SwazilandJordan M.D.   On: 04/28/2017 15:41     PMFS History: Patient Active Problem List   Diagnosis Date Noted  . Hyperthyroidism 02/01/2016   Past Medical History:  Diagnosis Date  . Hyperthyroidism 02/01/2016    Family History  Problem Relation Age of Onset  . Thyroid disease Neg Hx     No past surgical history on file. Social History   Occupational History  . Not on file  Tobacco Use  . Smoking status: Never Smoker  . Smokeless tobacco: Never Used  Substance and Sexual  Activity  . Alcohol use: No  . Drug use: No  . Sexual activity: Not on file

## 2017-05-04 LAB — SYNOVIAL CELL COUNT + DIFF, W/ CRYSTALS

## 2017-05-04 LAB — TIQ-NTM

## 2017-05-14 ENCOUNTER — Other Ambulatory Visit: Payer: Self-pay

## 2017-05-14 MED ORDER — LEVOTHYROXINE SODIUM 150 MCG PO TABS: 150.0000 ug | ORAL_TABLET | Freq: Every day | ORAL | 2 refills | Status: AC

## 2017-05-16 IMAGING — NM NM THYROID IMAGING W/ UPTAKE SINGLE (24 HR)
1 series · 1 of 1 positions shown · non-contrast
Comparison: None

CLINICAL DATA: Hyperthyroidism, clinically suspected Graves disease

EXAM:
THYROID SCAN AND UPTAKE - 24 HOURS
TECHNIQUE: Following the per oral administration of M-U9U sodium iodide, the
patient returned at 24 hours and uptake measurements were acquired
with the uptake probe centered on the neck. Thyroid imaging was
performed following the intravenous administration of the Vc-22m
Pertechnetate.
RADIOPHARMACEUTICALS:  8.3 MicroCuries M-U9U sodium iodide orally
and 10.04 mCi Hechnetium-AAm pertechnetate IV

[Series 1: anterior · 0.59mm/px · 1 of 1 slices shown]
[im 1/1]
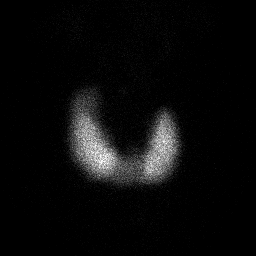

[1 of 1 positions shown; findings below may reference images not displayed]

FINDINGS: 24 hour radio iodine uptake is calculated at 65%, well above the
normal range consistent with hyperthyroidism.

Images of the thyroid gland in 3 projections demonstrate homogeneous
increased tracer distribution diffusely throughout both thyroid
lobes.

No focal areas of increased or decreased tracer localization seen.
IMPRESSION: Elevated 24 hour radio iodine uptake of 65%.

Diffusely increased trace localization both thyroid lobes without
nodularity.

Findings consistent with Graves disease.

## 2017-06-09 DIAGNOSIS — Z1389 Encounter for screening for other disorder: Secondary | ICD-10-CM | POA: Diagnosis not present

## 2017-06-09 DIAGNOSIS — R972 Elevated prostate specific antigen [PSA]: Secondary | ICD-10-CM | POA: Diagnosis not present

## 2017-06-09 DIAGNOSIS — E039 Hypothyroidism, unspecified: Secondary | ICD-10-CM | POA: Diagnosis not present

## 2017-06-09 DIAGNOSIS — Z136 Encounter for screening for cardiovascular disorders: Secondary | ICD-10-CM | POA: Diagnosis not present

## 2017-06-09 DIAGNOSIS — Z0000 Encounter for general adult medical examination without abnormal findings: Secondary | ICD-10-CM | POA: Diagnosis not present

## 2017-06-09 DIAGNOSIS — G479 Sleep disorder, unspecified: Secondary | ICD-10-CM | POA: Diagnosis not present

## 2017-06-09 DIAGNOSIS — Z23 Encounter for immunization: Secondary | ICD-10-CM | POA: Diagnosis not present

## 2017-06-09 DIAGNOSIS — Z125 Encounter for screening for malignant neoplasm of prostate: Secondary | ICD-10-CM | POA: Diagnosis not present

## 2017-06-09 DIAGNOSIS — R0683 Snoring: Secondary | ICD-10-CM | POA: Diagnosis not present

## 2017-06-23 DIAGNOSIS — H04123 Dry eye syndrome of bilateral lacrimal glands: Secondary | ICD-10-CM | POA: Diagnosis not present

## 2017-06-23 DIAGNOSIS — H40033 Anatomical narrow angle, bilateral: Secondary | ICD-10-CM | POA: Diagnosis not present

## 2017-09-05 DIAGNOSIS — H04123 Dry eye syndrome of bilateral lacrimal glands: Secondary | ICD-10-CM | POA: Diagnosis not present

## 2017-11-30 DIAGNOSIS — H10013 Acute follicular conjunctivitis, bilateral: Secondary | ICD-10-CM | POA: Diagnosis not present

## 2018-01-20 DIAGNOSIS — E0500 Thyrotoxicosis with diffuse goiter without thyrotoxic crisis or storm: Secondary | ICD-10-CM | POA: Diagnosis not present

## 2018-01-23 ENCOUNTER — Other Ambulatory Visit (HOSPITAL_COMMUNITY): Payer: Self-pay | Admitting: Oculoplastics Ophthalmology

## 2018-01-23 DIAGNOSIS — E0500 Thyrotoxicosis with diffuse goiter without thyrotoxic crisis or storm: Secondary | ICD-10-CM

## 2018-01-30 ENCOUNTER — Ambulatory Visit (HOSPITAL_COMMUNITY)
Admission: RE | Admit: 2018-01-30 | Discharge: 2018-01-30 | Disposition: A | Discharge status: Home / Self Care | Payer: BLUE CROSS/BLUE SHIELD | Source: Ambulatory Visit | Attending: Oculoplastics Ophthalmology | Admitting: Oculoplastics Ophthalmology

## 2018-01-30 DIAGNOSIS — H0520 Unspecified exophthalmos: Secondary | ICD-10-CM | POA: Insufficient documentation

## 2018-01-30 DIAGNOSIS — E0500 Thyrotoxicosis with diffuse goiter without thyrotoxic crisis or storm: Secondary | ICD-10-CM

## 2018-02-09 ENCOUNTER — Other Ambulatory Visit: Payer: Self-pay | Admitting: Endocrinology

## 2018-02-11 ENCOUNTER — Other Ambulatory Visit (HOSPITAL_COMMUNITY): Payer: Self-pay | Admitting: *Deleted

## 2018-02-12 ENCOUNTER — Ambulatory Visit (HOSPITAL_COMMUNITY)
Admission: RE | Admit: 2018-02-12 | Discharge: 2018-02-12 | Disposition: A | Discharge status: Home / Self Care | Payer: BLUE CROSS/BLUE SHIELD | Source: Ambulatory Visit | Attending: Oculoplastics Ophthalmology | Admitting: Oculoplastics Ophthalmology

## 2018-02-12 DIAGNOSIS — E0500 Thyrotoxicosis with diffuse goiter without thyrotoxic crisis or storm: Secondary | ICD-10-CM | POA: Diagnosis not present

## 2018-02-12 MED ORDER — SODIUM CHLORIDE 0.9 % IV SOLN
830.0000 mg | INTRAVENOUS | Status: DC
  Administered 2018-02-12: 830 mg via INTRAVENOUS
  Filled 2018-02-12: qty 6.64

## 2018-02-12 NOTE — Discharge Instructions (Signed)
Methylprednisolone Solution for Injection °What is this medicine? °METHYLPREDNISOLONE (meth ill pred NISS oh lone) is a corticosteroid. It is commonly used to treat inflammation of the skin, joints, lungs, and other organs. Common conditions treated include asthma, allergies, and arthritis. It is also used for other conditions, such as blood disorders and diseases of the adrenal glands. °This medicine may be used for other purposes; ask your health care provider or pharmacist if you have questions. °COMMON BRAND NAME(S): A-Methapred, Solu-Medrol °What should I tell my health care provider before I take this medicine? °They need to know if you have any of these conditions: °-Cushing's syndrome °-eye disease, vision problems °-diabetes °-glaucoma °-heart disease °-high blood pressure °-infection (especially a virus infection such as chickenpox, cold sores, or herpes) °-liver disease °-mental illness °-myasthenia gravis °-osteoporosis °-recently received or scheduled to receive a vaccine °-seizures °-stomach or intestine problems °-thyroid disease °-an unusual or allergic reaction to lactose, methylprednisolone, other medicines, foods, dyes, or preservatives °-pregnant or trying to get pregnant °-breast-feeding °How should I use this medicine? °This medicine is for injection or infusion into a vein. It is also for injection into a muscle. It is given by a health care professional in a hospital or clinic setting. °Talk to your pediatrician regarding the use of this medicine in children. While this drug may be prescribed for selected conditions, precautions do apply. °Overdosage: If you think you have taken too much of this medicine contact a poison control center or emergency room at once. °NOTE: This medicine is only for you. Do not share this medicine with others. °What if I miss a dose? °This does not apply. °What may interact with this medicine? °Do not take this medicine with any of the following  medications: °-alefacept °-echinacea °-iopamidol °-live virus vaccines °-metyrapone °-mifepristone °This medicine may also interact with the following medications: °-amphotericin B °-aspirin and aspirin-like medicines °-certain antibiotics like erythromycin, clarithromycin, troleandomycin °-certain medicines for diabetes °-certain medicines for fungal infection like ketoconazole °-certain medicines for seizures like carbamazepine, phenobarbital, phenytoin °-certain medicines that treat or prevent blood clots like warfarin °-cyclosporine °-digoxin °-diuretics °-male hormones, like estrogens and birth control pills °-isoniazid °-NSAIDS, medicines for pain and inflammation, like ibuprofen or naproxen °-other medicines for myasthenia gravis °-rifampin °-vaccines °This list may not describe all possible interactions. Give your health care provider a list of all the medicines, herbs, non-prescription drugs, or dietary supplements you use. Also tell them if you smoke, drink alcohol, or use illegal drugs. Some items may interact with your medicine. °What should I watch for while using this medicine? °Tell your doctor or healthcare professional if your symptoms do not start to get better or if they get worse. Do not stop taking except on your doctor's advice. You may develop a severe reaction. Your doctor will tell you how much medicine to take. °Your condition will be monitored carefully while you are receiving this medicine. °This medicine may increase your risk of getting an infection. Tell your doctor or health care professional if you are around anyone with measles or chickenpox, or if you develop sores or blisters that do not heal properly. °This medicine may affect blood sugar levels. If you have diabetes, check with your doctor or health care professional before you change your diet or the dose of your diabetic medicine. °Tell your doctor or health care professional right away if you have any change in your  eyesight. °Using this medicine for a long time may increase your risk of low bone   mass. Talk to your doctor about bone health. °What side effects may I notice from receiving this medicine? °Side effects that you should report to your doctor or health care professional as soon as possible: °-allergic reactions like skin rash, itching or hives, swelling of the face, lips, or tongue °-bloody or tarry stools °-changes in vision °-hallucination, loss of contact with reality °-muscle cramps °-muscle pain °-palpitations °-signs and symptoms of high blood sugar such as dizziness; dry mouth; dry skin; fruity breath; nausea; stomach pain; increased hunger or thirst; increased urination °-signs and symptoms of infection like fever or chills; cough; sore throat; pain or trouble passing urine °-trouble passing urine or change in the amount of urine °Side effects that usually do not require medical attention (report to your doctor or health care professional if they continue or are bothersome): °-changes in emotions or mood °-constipation °-diarrhea °-excessive hair growth on the face or body °-headache °-nausea, vomiting °-pain, redness, or irritation at site where injected °-trouble sleeping °-weight gain °This list may not describe all possible side effects. Call your doctor for medical advice about side effects. You may report side effects to FDA at 1-800-FDA-1088. °Where should I keep my medicine? °This drug is given in a hospital or clinic and will not be stored at home. °NOTE: This sheet is a summary. It may not cover all possible information. If you have questions about this medicine, talk to your doctor, pharmacist, or health care provider. °© 2018 [employer]vier/Gold Standard (2015-07-20 16:21:28) ° °

## 2018-02-18 ENCOUNTER — Other Ambulatory Visit (HOSPITAL_COMMUNITY): Payer: Self-pay | Admitting: *Deleted

## 2018-02-19 ENCOUNTER — Encounter (HOSPITAL_COMMUNITY)
Admission: RE | Admit: 2018-02-19 | Discharge: 2018-02-19 | Disposition: A | Discharge status: Home / Self Care | Payer: BLUE CROSS/BLUE SHIELD | Source: Ambulatory Visit | Attending: Oculoplastics Ophthalmology | Admitting: Oculoplastics Ophthalmology

## 2018-02-19 DIAGNOSIS — E0500 Thyrotoxicosis with diffuse goiter without thyrotoxic crisis or storm: Secondary | ICD-10-CM | POA: Insufficient documentation

## 2018-02-19 MED ORDER — SODIUM CHLORIDE 0.9 % IV SOLN
830.0000 mg | INTRAVENOUS | Status: DC
  Administered 2018-02-19: 830 mg via INTRAVENOUS
  Filled 2018-02-19: qty 6.64

## 2018-02-26 ENCOUNTER — Ambulatory Visit (HOSPITAL_COMMUNITY)
Admission: RE | Admit: 2018-02-26 | Discharge: 2018-02-26 | Disposition: A | Discharge status: Home / Self Care | Payer: BLUE CROSS/BLUE SHIELD | Source: Ambulatory Visit | Attending: Oculoplastics Ophthalmology | Admitting: Oculoplastics Ophthalmology

## 2018-02-26 DIAGNOSIS — E0500 Thyrotoxicosis with diffuse goiter without thyrotoxic crisis or storm: Secondary | ICD-10-CM | POA: Diagnosis not present

## 2018-02-26 MED ORDER — SODIUM CHLORIDE 0.9 % IV SOLN
830.0000 mg | INTRAVENOUS | Status: DC
  Administered 2018-02-26: 830 mg via INTRAVENOUS
  Filled 2018-02-26: qty 6.64

## 2018-03-03 DIAGNOSIS — E0500 Thyrotoxicosis with diffuse goiter without thyrotoxic crisis or storm: Secondary | ICD-10-CM | POA: Diagnosis not present

## 2018-03-05 ENCOUNTER — Ambulatory Visit (HOSPITAL_COMMUNITY)
Admission: RE | Admit: 2018-03-05 | Discharge: 2018-03-05 | Disposition: A | Discharge status: Home / Self Care | Payer: BLUE CROSS/BLUE SHIELD | Source: Ambulatory Visit | Attending: Oculoplastics Ophthalmology | Admitting: Oculoplastics Ophthalmology

## 2018-03-05 DIAGNOSIS — E0500 Thyrotoxicosis with diffuse goiter without thyrotoxic crisis or storm: Secondary | ICD-10-CM | POA: Diagnosis present

## 2018-03-05 MED ORDER — SODIUM CHLORIDE 0.9 % IV SOLN
830.0000 mg | INTRAVENOUS | Status: DC
  Administered 2018-03-05: 830 mg via INTRAVENOUS
  Filled 2018-03-05: qty 6.64

## 2018-03-12 ENCOUNTER — Encounter (HOSPITAL_COMMUNITY)
Admission: RE | Admit: 2018-03-12 | Discharge: 2018-03-12 | Disposition: A | Discharge status: Home / Self Care | Payer: BLUE CROSS/BLUE SHIELD | Source: Ambulatory Visit | Attending: Oculoplastics Ophthalmology | Admitting: Oculoplastics Ophthalmology

## 2018-03-12 DIAGNOSIS — E0500 Thyrotoxicosis with diffuse goiter without thyrotoxic crisis or storm: Secondary | ICD-10-CM | POA: Diagnosis not present

## 2018-03-12 MED ORDER — SODIUM CHLORIDE 0.9 % IV SOLN
830.0000 mg | INTRAVENOUS | Status: DC
  Administered 2018-03-12: 830 mg via INTRAVENOUS
  Filled 2018-03-12: qty 6.64

## 2018-03-19 ENCOUNTER — Ambulatory Visit (HOSPITAL_COMMUNITY)
Admission: RE | Admit: 2018-03-19 | Discharge: 2018-03-19 | Disposition: A | Discharge status: Home / Self Care | Payer: BLUE CROSS/BLUE SHIELD | Source: Ambulatory Visit | Attending: Oculoplastics Ophthalmology | Admitting: Oculoplastics Ophthalmology

## 2018-03-19 DIAGNOSIS — E0500 Thyrotoxicosis with diffuse goiter without thyrotoxic crisis or storm: Secondary | ICD-10-CM | POA: Diagnosis not present

## 2018-03-19 MED ORDER — SODIUM CHLORIDE 0.9 % IV SOLN
830.0000 mg | INTRAVENOUS | Status: DC
  Administered 2018-03-19: 830 mg via INTRAVENOUS
  Filled 2018-03-19: qty 6.64

## 2018-03-26 ENCOUNTER — Ambulatory Visit (HOSPITAL_COMMUNITY)
Admission: RE | Admit: 2018-03-26 | Discharge: 2018-03-26 | Disposition: A | Discharge status: Home / Self Care | Payer: BLUE CROSS/BLUE SHIELD | Source: Ambulatory Visit | Attending: Oculoplastics Ophthalmology | Admitting: Oculoplastics Ophthalmology

## 2018-03-26 DIAGNOSIS — E0500 Thyrotoxicosis with diffuse goiter without thyrotoxic crisis or storm: Secondary | ICD-10-CM | POA: Diagnosis not present

## 2018-03-26 MED ORDER — SODIUM CHLORIDE 0.9 % IV SOLN
415.0000 mg | INTRAVENOUS | Status: DC
  Administered 2018-03-26: 415 mg via INTRAVENOUS
  Filled 2018-03-26: qty 415

## 2018-03-31 DIAGNOSIS — H0520 Unspecified exophthalmos: Secondary | ICD-10-CM | POA: Diagnosis not present

## 2018-04-02 ENCOUNTER — Ambulatory Visit (HOSPITAL_COMMUNITY)
Admission: RE | Admit: 2018-04-02 | Discharge: 2018-04-02 | Disposition: A | Discharge status: Home / Self Care | Payer: BLUE CROSS/BLUE SHIELD | Source: Ambulatory Visit | Attending: Oculoplastics Ophthalmology | Admitting: Oculoplastics Ophthalmology

## 2018-04-02 DIAGNOSIS — E0500 Thyrotoxicosis with diffuse goiter without thyrotoxic crisis or storm: Secondary | ICD-10-CM | POA: Diagnosis not present

## 2018-04-02 MED ORDER — SODIUM CHLORIDE 0.9 % IV SOLN
415.0000 mg | INTRAVENOUS | Status: DC
  Administered 2018-04-02: 420 mg via INTRAVENOUS
  Filled 2018-04-02: qty 3.36

## 2018-04-05 DIAGNOSIS — H04123 Dry eye syndrome of bilateral lacrimal glands: Secondary | ICD-10-CM | POA: Diagnosis not present

## 2018-04-09 ENCOUNTER — Ambulatory Visit (HOSPITAL_COMMUNITY)
Admission: RE | Admit: 2018-04-09 | Discharge: 2018-04-09 | Disposition: A | Discharge status: Home / Self Care | Payer: BLUE CROSS/BLUE SHIELD | Source: Ambulatory Visit | Attending: Oculoplastics Ophthalmology | Admitting: Oculoplastics Ophthalmology

## 2018-04-09 DIAGNOSIS — E0500 Thyrotoxicosis with diffuse goiter without thyrotoxic crisis or storm: Secondary | ICD-10-CM | POA: Diagnosis not present

## 2018-04-09 MED ORDER — SODIUM CHLORIDE 0.9 % IV SOLN
415.0000 mg | INTRAVENOUS | Status: DC
  Administered 2018-04-09: 420 mg via INTRAVENOUS
  Filled 2018-04-09: qty 3.36

## 2018-04-10 DIAGNOSIS — H04123 Dry eye syndrome of bilateral lacrimal glands: Secondary | ICD-10-CM | POA: Diagnosis not present

## 2018-04-16 ENCOUNTER — Ambulatory Visit (HOSPITAL_COMMUNITY)
Admission: RE | Admit: 2018-04-16 | Discharge: 2018-04-16 | Disposition: A | Discharge status: Home / Self Care | Payer: BLUE CROSS/BLUE SHIELD | Source: Ambulatory Visit | Attending: Oculoplastics Ophthalmology | Admitting: Oculoplastics Ophthalmology

## 2018-04-16 DIAGNOSIS — E0500 Thyrotoxicosis with diffuse goiter without thyrotoxic crisis or storm: Secondary | ICD-10-CM | POA: Diagnosis not present

## 2018-04-16 DIAGNOSIS — H1013 Acute atopic conjunctivitis, bilateral: Secondary | ICD-10-CM | POA: Diagnosis not present

## 2018-04-16 MED ORDER — SODIUM CHLORIDE 0.9 % IV SOLN
415.0000 mg | INTRAVENOUS | Status: DC
  Administered 2018-04-16: 420 mg via INTRAVENOUS
  Filled 2018-04-16: qty 3.36

## 2018-04-24 ENCOUNTER — Ambulatory Visit (HOSPITAL_COMMUNITY)
Admission: RE | Admit: 2018-04-24 | Discharge: 2018-04-24 | Disposition: A | Discharge status: Home / Self Care | Payer: BLUE CROSS/BLUE SHIELD | Source: Ambulatory Visit | Attending: Oculoplastics Ophthalmology | Admitting: Oculoplastics Ophthalmology

## 2018-04-24 DIAGNOSIS — E0500 Thyrotoxicosis with diffuse goiter without thyrotoxic crisis or storm: Secondary | ICD-10-CM | POA: Insufficient documentation

## 2018-04-24 MED ORDER — SODIUM CHLORIDE 0.9 % IV SOLN
415.0000 mg | INTRAVENOUS | Status: DC
  Administered 2018-04-24: 420 mg via INTRAVENOUS
  Filled 2018-04-24: qty 3.36

## 2018-04-30 ENCOUNTER — Ambulatory Visit (HOSPITAL_COMMUNITY)
Admission: RE | Admit: 2018-04-30 | Discharge: 2018-04-30 | Disposition: A | Discharge status: Home / Self Care | Payer: BLUE CROSS/BLUE SHIELD | Source: Ambulatory Visit | Attending: Oculoplastics Ophthalmology | Admitting: Oculoplastics Ophthalmology

## 2018-04-30 DIAGNOSIS — E0500 Thyrotoxicosis with diffuse goiter without thyrotoxic crisis or storm: Secondary | ICD-10-CM | POA: Diagnosis not present

## 2018-04-30 MED ORDER — SODIUM CHLORIDE 0.9 % IV SOLN
415.0000 mg | INTRAVENOUS | Status: DC
  Administered 2018-04-30: 415 mg via INTRAVENOUS
  Filled 2018-04-30: qty 415

## 2018-06-10 DIAGNOSIS — Z125 Encounter for screening for malignant neoplasm of prostate: Secondary | ICD-10-CM | POA: Diagnosis not present

## 2018-06-10 DIAGNOSIS — E7800 Pure hypercholesterolemia, unspecified: Secondary | ICD-10-CM | POA: Diagnosis not present

## 2018-06-10 DIAGNOSIS — Z0000 Encounter for general adult medical examination without abnormal findings: Secondary | ICD-10-CM | POA: Diagnosis not present

## 2018-06-10 DIAGNOSIS — Z131 Encounter for screening for diabetes mellitus: Secondary | ICD-10-CM | POA: Diagnosis not present

## 2018-06-10 DIAGNOSIS — Z1389 Encounter for screening for other disorder: Secondary | ICD-10-CM | POA: Diagnosis not present

## 2018-06-10 DIAGNOSIS — Z23 Encounter for immunization: Secondary | ICD-10-CM | POA: Diagnosis not present

## 2018-06-10 DIAGNOSIS — R972 Elevated prostate specific antigen [PSA]: Secondary | ICD-10-CM | POA: Diagnosis not present

## 2018-06-10 DIAGNOSIS — E039 Hypothyroidism, unspecified: Secondary | ICD-10-CM | POA: Diagnosis not present

## 2018-06-16 ENCOUNTER — Ambulatory Visit (INDEPENDENT_AMBULATORY_CARE_PROVIDER_SITE_OTHER): Payer: BLUE CROSS/BLUE SHIELD | Admitting: Orthopaedic Surgery

## 2018-06-18 ENCOUNTER — Telehealth (INDEPENDENT_AMBULATORY_CARE_PROVIDER_SITE_OTHER): Payer: Self-pay

## 2018-06-18 ENCOUNTER — Ambulatory Visit (INDEPENDENT_AMBULATORY_CARE_PROVIDER_SITE_OTHER): Payer: BLUE CROSS/BLUE SHIELD

## 2018-06-18 ENCOUNTER — Ambulatory Visit (INDEPENDENT_AMBULATORY_CARE_PROVIDER_SITE_OTHER): Payer: BLUE CROSS/BLUE SHIELD | Admitting: Orthopaedic Surgery

## 2018-06-18 ENCOUNTER — Encounter (INDEPENDENT_AMBULATORY_CARE_PROVIDER_SITE_OTHER): Payer: Self-pay | Admitting: Orthopaedic Surgery

## 2018-06-18 DIAGNOSIS — M170 Bilateral primary osteoarthritis of knee: Secondary | ICD-10-CM

## 2018-06-18 DIAGNOSIS — M25561 Pain in right knee: Secondary | ICD-10-CM | POA: Diagnosis not present

## 2018-06-18 MED ORDER — LIDOCAINE HCL 1 % IJ SOLN
3.0000 mL | INTRAMUSCULAR | Status: AC | PRN
  Administered 2018-06-18: 3 mL

## 2018-06-18 MED ORDER — BUPIVACAINE HCL 0.25 % IJ SOLN
0.6600 mL | INTRAMUSCULAR | Status: AC | PRN
  Administered 2018-06-18: .66 mL via INTRA_ARTICULAR

## 2018-06-18 MED ORDER — METHYLPREDNISOLONE ACETATE 40 MG/ML IJ SUSP
13.3300 mg | INTRAMUSCULAR | Status: AC | PRN
  Administered 2018-06-18: 13.33 mg via INTRA_ARTICULAR

## 2018-06-18 NOTE — Progress Notes (Signed)
Office Visit Note   Patient: Jordan Barajas           Date of Birth: [birth year]           MRN: [id number] Visit Date: 06/18/2018              Requested by: Georgann Housekeeper, MD [address] [place], [place] [address] PCP: Georgann Housekeeper, MD   Assessment & Plan: Visit Diagnoses:  1. Bilateral primary osteoarthritis of knee   2. Right knee pain, unspecified chronicity     Plan: Impression is end-stage degenerative joint disease bilateral knees left greater than right.  We will inject both knees with cortisone today.  We will also send for approval for Visco supplementation injection.  I discussed with patient his eventual need for bilateral sequential total knee replacements.  We have provided him with a handout for this.  Follow-up with [place] as needed.  Follow-Up Instructions: Return if symptoms worsen or fail to improve.   Orders:  Orders Placed This Encounter  Procedures  . Large Joint Inj: bilateral knee  . XR KNEE 3 VIEW RIGHT  . XR KNEE 3 VIEW LEFT   No orders of the defined types were placed in this encounter.     Procedures: Large Joint Inj: bilateral knee on 06/18/2018 8:48 AM Indications: pain Details: 22 G needle, anterolateral approach Medications (Right): 0.66 mL bupivacaine 0.25 %; 3 mL lidocaine 1 %; 13.33 mg methylPREDNISolone acetate 40 MG/ML Medications (Left): 0.66 mL bupivacaine 0.25 %; 3 mL lidocaine 1 %; 13.33 mg methylPREDNISolone acetate 40 MG/ML      Clinical Data: No additional findings.   Subjective: Chief Complaint  Patient presents with  . Right Knee - Pain  . Left Knee - Pain    HPI patient is a pleasant 63 year old gentleman who presents our clinic today with bilateral knee pain left greater than right.  History of end-stage degenerative joint disease left knee.  He was seen in our office last in December 2018 where he had a left knee cortisone injection.  He does not remember getting this and he does not remember any relief  of symptoms following the appointment.  He comes in today for further evaluation treatment recommendation.  The pain he has is to the medial aspect of both knees.  No mechanical symptoms or instability.  Pain appears to be worse going from seated to standing position as well as going up and down stairs, climbing ladders and putting on his clothes and shoes.  He does get some relief with rest.  He has been taking meloxicam without relief of symptoms.  Review of Systems as detailed in HPI.  All other review and are negative.   Objective: Vital Signs: There were no vitals taken for this visit.  Physical Exam well-developed well-nourished gentleman no acute distress.  Alert and oriented x3.  Ortho Exam examination of both knees reveals varus deformity.  Medial joint line tenderness on the left.  Collaterals are stable.  Moderate dull femoral crepitus.  He is neurovascular intact distally.  Specialty Comments:  No specialty comments available.  Imaging: Xr Knee 3 View Left  Result Date: 06/18/2018 X-rays demonstrate marked tricompartmental degenerative changes  Xr Knee 3 View Right  Result Date: 06/18/2018 X-rays demonstrate marked tricompartmental degenerative changes    PMFS History: Patient Active Problem List   Diagnosis Date Noted  . Bilateral primary osteoarthritis of knee 06/18/2018  . Hyperthyroidism 02/01/2016   Past Medical History:  Diagnosis Date  .  Hyperthyroidism 02/01/2016    Family History  Problem Relation Age of Onset  . Thyroid disease Neg Hx     History reviewed. No pertinent surgical history. Social History   Occupational History  . Not on file  Tobacco Use  . Smoking status: Never Smoker  . Smokeless tobacco: Never Used  Substance and Sexual Activity  . Alcohol use: No  . Drug use: No  . Sexual activity: Not on file

## 2018-06-18 NOTE — Telephone Encounter (Signed)
Noted  

## 2018-06-18 NOTE — Telephone Encounter (Signed)
Please submit for Bil knee Gel injections.-Dr Roda Shutters.

## 2018-07-08 ENCOUNTER — Telehealth (INDEPENDENT_AMBULATORY_CARE_PROVIDER_SITE_OTHER): Payer: Self-pay

## 2018-07-08 NOTE — Telephone Encounter (Signed)
Submitted VOB for SynviscOne, bilateral knee. 

## 2018-07-27 DIAGNOSIS — E0590 Thyrotoxicosis, unspecified without thyrotoxic crisis or storm: Secondary | ICD-10-CM | POA: Diagnosis not present

## 2018-08-05 ENCOUNTER — Encounter (INDEPENDENT_AMBULATORY_CARE_PROVIDER_SITE_OTHER): Payer: Self-pay | Admitting: Radiology

## 2018-08-05 NOTE — Progress Notes (Unsigned)
I called patient and LM for patient and advised that insurance covers at 100%, and he is authorized, auth # [id number], valid 08/03/18 through 08/03/19, 96 units.  If patient calls you can schedule for bilateral Synvisc One injections.

## 2018-08-12 DIAGNOSIS — R0683 Snoring: Secondary | ICD-10-CM | POA: Diagnosis not present

## 2018-08-12 DIAGNOSIS — I10 Essential (primary) hypertension: Secondary | ICD-10-CM | POA: Diagnosis not present

## 2018-10-22 DIAGNOSIS — I10 Essential (primary) hypertension: Secondary | ICD-10-CM | POA: Diagnosis not present

## 2018-11-17 DIAGNOSIS — G4733 Obstructive sleep apnea (adult) (pediatric): Secondary | ICD-10-CM | POA: Diagnosis not present

## 2018-11-18 DIAGNOSIS — G4733 Obstructive sleep apnea (adult) (pediatric): Secondary | ICD-10-CM | POA: Diagnosis not present

## 2019-01-07 DIAGNOSIS — G4733 Obstructive sleep apnea (adult) (pediatric): Secondary | ICD-10-CM | POA: Diagnosis not present

## 2019-04-07 ENCOUNTER — Telehealth: Payer: Self-pay

## 2019-04-07 ENCOUNTER — Other Ambulatory Visit: Payer: Self-pay

## 2019-04-07 ENCOUNTER — Encounter: Payer: Self-pay | Admitting: Orthopaedic Surgery

## 2019-04-07 ENCOUNTER — Ambulatory Visit: Payer: BC Managed Care – PPO | Admitting: Orthopaedic Surgery

## 2019-04-07 ENCOUNTER — Ambulatory Visit: Payer: Self-pay

## 2019-04-07 DIAGNOSIS — M25562 Pain in left knee: Secondary | ICD-10-CM

## 2019-04-07 DIAGNOSIS — G8929 Other chronic pain: Secondary | ICD-10-CM

## 2019-04-07 DIAGNOSIS — M25561 Pain in right knee: Secondary | ICD-10-CM | POA: Diagnosis not present

## 2019-04-07 MED ORDER — DICLOFENAC SODIUM 1 % EX GEL: 2.0000 g | Freq: Four times a day (QID) | CUTANEOUS | 1 refills | Status: DC

## 2019-04-07 NOTE — Telephone Encounter (Signed)
Please submit for BIL knee gel injections. Dr. Erlinda Hong

## 2019-04-07 NOTE — Progress Notes (Signed)
Office Visit Note   Patient: Jordan Barajas           Date of Birth: 05/16/1956           MRN: [id number] Visit Date: 04/07/2019              Requested by: Georgann Housekeeper, MD [address] [place],  [place] [address] PCP: Georgann Housekeeper, MD   Assessment & Plan: Visit Diagnoses:  1. Chronic pain of both knees     Plan: Impression is bilateral knee end-stage generative joint disease left greater than right.  At this point, the patient would like to wait till the first of the year before proceeding with left total knee replacement.  He would like to go ahead and get viscosupplementation injections in the meantime.  We will submit for approval for this.  He will follow-up with [place] once approved.  Upon visit, he will go ahead and schedule his left total knee replacement.  This patient is diagnosed with osteoarthritis of the knee(s).    Radiographs show evidence of joint space narrowing, osteophytes, subchondral sclerosis and/or subchondral cysts.  This patient has knee pain which interferes with functional and activities of daily living.    This patient has experienced inadequate response, adverse effects and/or intolerance with conservative treatments such as acetaminophen, NSAIDS, topical creams, physical therapy or regular exercise, knee bracing and/or weight loss.   This patient has experienced inadequate response or has a contraindication to intra articular steroid injections for at least 3 months.   This patient is not scheduled to have a total knee replacement within 6 months of starting treatment with viscosupplementation.    Follow-Up Instructions: Return for once approved for bilateral knee visco inj.   Orders:  Orders Placed This Encounter  Procedures  . XR KNEE 3 VIEW RIGHT  . XR KNEE 3 VIEW LEFT   Meds ordered this encounter  Medications  . diclofenac Sodium (VOLTAREN) 1 % GEL    Sig: Apply 2 g topically 4 (four) times daily.    Dispense:  150 g   Refill:  1      Procedures: No procedures performed   Clinical Data: No additional findings.   Subjective: Chief Complaint  Patient presents with  . Right Knee - Pain  . Left Knee - Pain    HPI patient is a pleasant 63 year old gentleman who presents our clinic today with recurrent bilateral knee pain left greater than right.  He has a several year history of end-stage generative joint disease.  He was last seen in our office in January of this year where both knees were injected with cortisone.  He had relief of symptoms for only a few weeks.  His pain has returned and has started to worsen.  The majority of his pain is to the medial aspect.  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  He does note occasional night pain.  He does not take any medication for this.  No previous viscosupplementation injection.    Objective: Vital Signs: There were no vitals taken for this visit.   Ortho Exam examination of both knees shows no effusion.  Range of motion 0 to 120 degrees.  Medial joint line tenderness.  Moderate patellofemoral crepitus.  He is neurovascularly intact distally.  Specialty Comments:  No specialty comments available.  Imaging: Xr Knee 3 View Left  Result Date: 04/07/2019 Marked  degenerative changes worse medial and patellofemoral compartments  Xr Knee 3 View Right  Result Date: 04/07/2019 Marked degenerative changes worse medial and patellofemoral compartments    PMFS History: Patient Active Problem List   Diagnosis Date Noted  . Bilateral primary osteoarthritis of knee 06/18/2018  . Hyperthyroidism 02/01/2016   Past Medical History:  Diagnosis Date  . Hyperthyroidism 02/01/2016    Family History  Problem Relation Age of Onset  . Thyroid disease Neg Hx     History reviewed. No pertinent surgical history. Social History   Occupational History  . Not on file  Tobacco Use  .  Smoking status: Never Smoker  . Smokeless tobacco: Never Used  Substance and Sexual Activity  . Alcohol use: No  . Drug use: No  . Sexual activity: Not on file

## 2019-04-07 NOTE — Telephone Encounter (Signed)
See other message

## 2019-04-08 NOTE — Telephone Encounter (Signed)
Submitted online [id number] for bilateral Durolane injections.  Xu patient.

## 2019-04-09 NOTE — Telephone Encounter (Signed)
Insurance covers at 100%, $50 copay on date of service.  PA required, will call to initiate.  Buy and bill ok.  Bilateral knees Durolane.

## 2019-04-13 NOTE — Telephone Encounter (Signed)
PA request faxed in.  Will follow up.

## 2019-04-15 NOTE — Telephone Encounter (Signed)
I called to f/u, auth still pending, #[id number] per [place] at [facility].

## 2019-04-16 ENCOUNTER — Telehealth: Payer: Self-pay

## 2019-04-16 NOTE — Telephone Encounter (Signed)
Received vm for this pt wishing to speak with you regarding knee injection auth they are working on

## 2019-04-16 NOTE — Telephone Encounter (Signed)
Prior auth # [id number], for 120 units approved.

## 2019-04-16 NOTE — Telephone Encounter (Signed)
Please call and schedule patient for appt with Dr Erlinda Hong, bilateral Durolane, auth done, see below.  Patient will have a $50 copay and insurance should cover the rest.  Thanks.

## 2019-05-04 ENCOUNTER — Encounter: Payer: Self-pay | Admitting: Orthopaedic Surgery

## 2019-05-04 ENCOUNTER — Other Ambulatory Visit: Payer: Self-pay

## 2019-05-04 ENCOUNTER — Ambulatory Visit: Payer: BC Managed Care – PPO | Admitting: Orthopaedic Surgery

## 2019-05-04 VITALS — Ht 69.0 in | Wt 225.0 lb

## 2019-05-04 DIAGNOSIS — M1712 Unilateral primary osteoarthritis, left knee: Secondary | ICD-10-CM

## 2019-05-04 DIAGNOSIS — M170 Bilateral primary osteoarthritis of knee: Secondary | ICD-10-CM

## 2019-05-04 DIAGNOSIS — M1711 Unilateral primary osteoarthritis, right knee: Secondary | ICD-10-CM

## 2019-05-04 MED ORDER — LIDOCAINE HCL 1 % IJ SOLN
3.0000 mL | INTRAMUSCULAR | Status: AC | PRN
  Administered 2019-05-04: 3 mL

## 2019-05-04 MED ORDER — SODIUM HYALURONATE 60 MG/3ML IX PRSY
60.0000 mg | PREFILLED_SYRINGE | INTRA_ARTICULAR | Status: AC | PRN
  Administered 2019-05-04: 60 mg via INTRA_ARTICULAR

## 2019-05-04 MED ORDER — BUPIVACAINE HCL 0.25 % IJ SOLN
0.6600 mL | INTRAMUSCULAR | Status: AC | PRN
  Administered 2019-05-04: .66 mL via INTRA_ARTICULAR

## 2019-05-04 NOTE — Progress Notes (Signed)
   Procedure Note  Patient: Jordan Barajas             Date of Birth: 01/06/1956           MRN: [id number]             Visit Date: 05/04/2019  Procedures: Visit Diagnoses:  1. Bilateral primary osteoarthritis of knee     Large Joint Inj: bilateral knee on 05/04/2019 8:32 AM Indications: pain Details: 22 G needle, anterolateral approach Medications (Right): 0.66 mL bupivacaine 0.25 %; 60 mg Sodium Hyaluronate 60 MG/3ML; 3 mL lidocaine 1 % Medications (Left): 0.66 mL bupivacaine 0.25 %; 60 mg Sodium Hyaluronate 60 MG/3ML; 3 mL lidocaine 1 %

## 2019-07-08 ENCOUNTER — Encounter: Payer: Self-pay | Admitting: Orthopaedic Surgery

## 2019-07-08 ENCOUNTER — Ambulatory Visit: Payer: BC Managed Care – PPO | Admitting: Orthopaedic Surgery

## 2019-07-08 ENCOUNTER — Other Ambulatory Visit: Payer: Self-pay

## 2019-07-08 VITALS — Ht 68.0 in | Wt 224.0 lb

## 2019-07-08 DIAGNOSIS — M170 Bilateral primary osteoarthritis of knee: Secondary | ICD-10-CM

## 2019-07-08 NOTE — Progress Notes (Signed)
   Office Visit Note   Patient: Jordan Barajas           Date of Birth: 03-17-1956           MRN: [id number] Visit Date: 07/08/2019              Requested by: Georgann Housekeeper, MD [address] [place],  [place] [address] PCP: Georgann Housekeeper, MD   Assessment & Plan: Visit Diagnoses:  1. Bilateral primary osteoarthritis of knee     Plan: Impression is advanced degenerative joint disease both knees left greater than right.  Patient has failed conservative treatments such as cortisone injection, visco, knee braces, walking with cane and would like to proceed with definitive treatment of total knee replacement.  He would like to proceed with the left one first.  Risk, benefits and possible occasions reviewed.  Rehab recovery time discussed.  All questions were answered.  Denies aspirin, nickel allergy.  Denies h/o DVT.  Follow-Up Instructions: Return for 2 week postop .   Orders:  No orders of the defined types were placed in this encounter.  No orders of the defined types were placed in this encounter.     Procedures: No procedures performed   Clinical Data: No additional findings.   Subjective: Chief Complaint  Patient presents with  . Right Knee - Pain  . Left Knee - Pain    HPI patient is a pleasant 64 year old gentleman who comes in today with recurrent bilateral knee pain left greater than right.  History of advanced degenerative joint disease both knees.  He had cortisone injections to both knees over a year ago and viscosupplementation injection to both knees this past December.  Neither set of injections seem to relieve his symptoms.  The pain he has is to the entire knee worse with ambulation and going up and down stairs.  He has tried topical anti-inflammatories without relief of symptoms.  He does wear knee brace which does provide some support.  Review of Systems as detailed in HPI.  All others reviewed and are negative.   Objective: Vital Signs: Ht  5\' 8"  (1.727 m)   Wt 224 lb (101.6 kg)   BMI 34.06 kg/m   Physical Exam well-developed well-nourished gentleman in no acute distress.  Alert and oriented x3.  Ortho Exam examination of both knees reveals range of motion from 0 to 110 degrees.  Mild patellofemoral crepitus.  Medial joint line tenderness.  Is neurovascular intact distally.  Specialty Comments:  No specialty comments available.  Imaging: No new imaging   PMFS History: Patient Active Problem List   Diagnosis Date Noted  . Bilateral primary osteoarthritis of knee 06/18/2018  . Hyperthyroidism 02/01/2016   Past Medical History:  Diagnosis Date  . Hyperthyroidism 02/01/2016    Family History  Problem Relation Age of Onset  . Thyroid disease Neg Hx     History reviewed. No pertinent surgical history. Social History   Occupational History  . Not on file  Tobacco Use  . Smoking status: Never Smoker  . Smokeless tobacco: Never Used  Substance and Sexual Activity  . Alcohol use: No  . Drug use: No  . Sexual activity: Not on file

## 2019-07-09 ENCOUNTER — Other Ambulatory Visit: Payer: Self-pay

## 2019-07-14 NOTE — Pre-Procedure Instructions (Signed)
Sidhant Helderman  07/14/2019      [id number] [address], [place] [address] Phone: [phone] Fax: [phone]    Your procedure is scheduled on Feb. 22  Report to [facility] entrance A at 10:15  A.M.  Call this number if you have problems the morning of surgery:  [phone]   Remember:  Do not eat  after midnight.   You may drink clear liquids until 9:15 A.M.  The morning of surgery.   Clear liquids allowed are:                    Water, Juice (non-citric and without pulp), Carbonated beverages, Clear Tea, Black Coffee only, Plain Jell-O only, Gatorade and Plain Popsicles only                     Enhanced Recovery after Surgery for Orthopedics Enhanced Recovery after Surgery is a protocol used to improve the stress on your body and your recovery after surgery.  Patient Instructions  . The night before surgery:  o No food after midnight. ONLY clear liquids after midnight  .  Marland Kitchen The day of surgery (if you do NOT have diabetes):  o Drink ONE (1) Pre-Surgery Clear Ensure as directed.  ( Drink by 9:15 A.M.) o This drink was given to you during your hospital  pre-op appointment visit. o The pre-op nurse will instruct you on the time to drink the  Pre-Surgery Ensure depending on your surgery time. o Finish the drink at the designated time by the pre-op nurse.  o Nothing else to drink after completing the  Pre-Surgery Clear Ensure.           If you have questions, please contact your surgeon's office.     Take these medicines the morning of surgery with A SIP OF WATER :              Amlodipine (norvasc)             Levothyroxine (synthroid)                         7 days prior to surgery STOP taking any Aspirin (unless otherwise instructed by your surgeon), Aleve, Naproxen, Ibuprofen, Motrin, Advil, Goody's, BC's, all herbal medications, fish oil, and all vitamins.    Do not wear jewelry.  Do not wear  lotions, powders, or perfumes, or deodorant.  Do not shave 48 hours prior to surgery.  Men may shave face and neck.  Do not bring valuables to the hospital.  [facility] is not responsible for any belongings or valuables.  Contacts, dentures or bridgework may not be worn into surgery.  Leave your suitcase in the car.  After surgery it may be brought to your room.  For patients admitted to the hospital, discharge time will be determined by your treatment team.  Patients discharged the day of surgery will not be allowed to drive home.    Special instructions:  [place]- Preparing For Surgery  Before surgery, you can play an important role. Because skin is not sterile, your skin needs to be as free of germs as possible. You can reduce the number of germs on your skin by washing with CHG (chlorahexidine gluconate) Soap before surgery.  CHG is an antiseptic cleaner which kills germs and bonds with the skin to continue killing germs even after washing.  Oral Hygiene is also important to reduce your risk of infection.  Remember - BRUSH YOUR TEETH THE MORNING OF SURGERY WITH YOUR REGULAR TOOTHPASTE  Please do not use if you have an allergy to CHG or antibacterial soaps. If your skin becomes reddened/irritated stop using the CHG.  Do not shave (including legs and underarms) for at least 48 hours prior to first CHG shower. It is OK to shave your face.  Please follow these instructions carefully.   1. Shower the NIGHT BEFORE SURGERY and the MORNING OF SURGERY with CHG.   2. If you chose to wash your hair, wash your hair first as usual with your normal shampoo.  3. After you shampoo, rinse your hair and body thoroughly to remove the shampoo.  4. Use CHG as you would any other liquid soap. You can apply CHG directly to the skin and wash gently with a scrungie or a clean washcloth.   5. Apply the CHG Soap to your body ONLY FROM THE NECK DOWN.  Do not use on open wounds or open sores. Avoid  contact with your eyes, ears, mouth and genitals (private parts). Wash Face and genitals (private parts)  with your normal soap.  6. Wash thoroughly, paying special attention to the area where your surgery will be performed.  7. Thoroughly rinse your body with warm water from the neck down.  8. DO NOT shower/wash with your normal soap after using and rinsing off the CHG Soap.  9. Pat yourself dry with a CLEAN TOWEL.  10. Wear CLEAN PAJAMAS to bed the night before surgery, wear comfortable clothes the morning of surgery  11. Place CLEAN SHEETS on your bed the night of your first shower and DO NOT SLEEP WITH PETS.    Day of Surgery:  Do not apply any deodorants/lotions.  Please wear clean clothes to the hospital/surgery center.   Remember to brush your teeth WITH YOUR REGULAR TOOTHPASTE.    Please read over the following fact sheets that you were given. Coughing and Deep Breathing, Total Joint Packet and Surgical Site Infection Prevention

## 2019-07-15 ENCOUNTER — Encounter (HOSPITAL_COMMUNITY): Payer: Self-pay

## 2019-07-15 ENCOUNTER — Encounter (HOSPITAL_COMMUNITY)
Admission: RE | Admit: 2019-07-15 | Discharge: 2019-07-15 | Disposition: A | Discharge status: Home / Self Care | Payer: BC Managed Care – PPO | Source: Ambulatory Visit | Attending: Physician Assistant | Admitting: Physician Assistant

## 2019-07-15 ENCOUNTER — Other Ambulatory Visit: Payer: Self-pay

## 2019-07-15 ENCOUNTER — Other Ambulatory Visit (HOSPITAL_COMMUNITY)
Admission: RE | Admit: 2019-07-15 | Discharge: 2019-07-15 | Disposition: A | Discharge status: Home / Self Care | Payer: BC Managed Care – PPO | Source: Ambulatory Visit | Attending: Orthopaedic Surgery | Admitting: Orthopaedic Surgery

## 2019-07-15 ENCOUNTER — Encounter (HOSPITAL_COMMUNITY)
Admission: RE | Admit: 2019-07-15 | Discharge: 2019-07-15 | Disposition: A | Discharge status: Home / Self Care | Payer: BC Managed Care – PPO | Source: Ambulatory Visit | Attending: Orthopaedic Surgery | Admitting: Orthopaedic Surgery

## 2019-07-15 DIAGNOSIS — Z20822 Contact with and (suspected) exposure to covid-19: Secondary | ICD-10-CM | POA: Diagnosis not present

## 2019-07-15 DIAGNOSIS — R9431 Abnormal electrocardiogram [ECG] [EKG]: Secondary | ICD-10-CM | POA: Diagnosis not present

## 2019-07-15 DIAGNOSIS — M1712 Unilateral primary osteoarthritis, left knee: Secondary | ICD-10-CM | POA: Diagnosis not present

## 2019-07-15 DIAGNOSIS — Z96652 Presence of left artificial knee joint: Secondary | ICD-10-CM | POA: Diagnosis not present

## 2019-07-15 DIAGNOSIS — Z01818 Encounter for other preprocedural examination: Secondary | ICD-10-CM | POA: Diagnosis not present

## 2019-07-15 HISTORY — DX: Unspecified osteoarthritis, unspecified site: M19.90

## 2019-07-15 LAB — URINALYSIS, ROUTINE W REFLEX MICROSCOPIC
Bilirubin Urine: NEGATIVE
Glucose, UA: NEGATIVE mg/dL
Ketones, ur: NEGATIVE mg/dL
Leukocytes,Ua: NEGATIVE
Nitrite: NEGATIVE
Protein, ur: NEGATIVE mg/dL
Specific Gravity, Urine: 1.02 (ref 1.005–1.030)
pH: 5 (ref 5.0–8.0)

## 2019-07-15 LAB — COMPREHENSIVE METABOLIC PANEL
ALT: 24 U/L (ref 0–44)
AST: 24 U/L (ref 15–41)
Albumin: 4 g/dL (ref 3.5–5.0)
Alkaline Phosphatase: 54 U/L (ref 38–126)
Anion gap: 9 (ref 5–15)
BUN: 12 mg/dL (ref 8–23)
CO2: 25 mmol/L (ref 22–32)
Calcium: 9.2 mg/dL (ref 8.9–10.3)
Chloride: 106 mmol/L (ref 98–111)
Creatinine, Ser: 1.45 mg/dL — ABNORMAL HIGH (ref 0.61–1.24)
GFR calc Af Amer: 60 mL/min — ABNORMAL LOW (ref >60)
GFR calc non Af Amer: 52 mL/min — ABNORMAL LOW (ref >60)
Glucose, Bld: 105 mg/dL — ABNORMAL HIGH (ref 70–99)
Potassium: 4 mmol/L (ref 3.5–5.1)
Sodium: 140 mmol/L (ref 135–145)
Total Bilirubin: 0.9 mg/dL (ref 0.3–1.2)
Total Protein: 7.7 g/dL (ref 6.5–8.1)

## 2019-07-15 LAB — CBC WITH DIFFERENTIAL/PLATELET
Abs Immature Granulocytes: 0.02 10*3/uL (ref 0.00–0.07)
Basophils Absolute: 0 10*3/uL (ref 0.0–0.1)
Basophils Relative: 0 %
Eosinophils Absolute: 0.3 10*3/uL (ref 0.0–0.5)
Eosinophils Relative: 6 %
HCT: 43.7 % (ref 39.0–52.0)
Hemoglobin: 15.6 g/dL (ref 13.0–17.0)
Immature Granulocytes: 0 %
Lymphocytes Relative: 27 %
Lymphs Abs: 1.4 10*3/uL (ref 0.7–4.0)
MCH: 28.6 pg (ref 26.0–34.0)
MCHC: 35.7 g/dL (ref 30.0–36.0)
MCV: 80.2 fL (ref 80.0–100.0)
Monocytes Absolute: 0.6 10*3/uL (ref 0.1–1.0)
Monocytes Relative: 11 %
Neutro Abs: 2.8 10*3/uL (ref 1.7–7.7)
Neutrophils Relative %: 56 %
Platelets: 217 10*3/uL (ref 150–400)
RBC: 5.45 MIL/uL (ref 4.22–5.81)
RDW: 13 % (ref 11.5–15.5)
WBC: 5.1 10*3/uL (ref 4.0–10.5)
nRBC: 0 % (ref 0.0–0.2)

## 2019-07-15 LAB — TYPE AND SCREEN
ABO/RH(D): O POS
Antibody Screen: NEGATIVE

## 2019-07-15 LAB — ABO/RH: ABO/RH(D): O POS

## 2019-07-15 LAB — SURGICAL PCR SCREEN
MRSA, PCR: POSITIVE — AB
Staphylococcus aureus: POSITIVE — AB

## 2019-07-15 LAB — SARS CORONAVIRUS 2 (TAT 6-24 HRS): SARS Coronavirus 2: NEGATIVE

## 2019-07-15 LAB — APTT: aPTT: 34 seconds (ref 24–36)

## 2019-07-15 LAB — PROTIME-INR
INR: 1 (ref 0.8–1.2)
Prothrombin Time: 13.2 seconds (ref 11.4–15.2)

## 2019-07-15 NOTE — Progress Notes (Signed)
Surgical scheduler notified via VM of pt's positive surgical PCR results. Pt also notified via phone. Pt will need profend on DOS.   Viviano Simas, RN

## 2019-07-15 NOTE — Progress Notes (Signed)
PCP - Dr. Georgann Housekeeper Cardiologist - denies  PPM/ICD - N/A Device Orders -N/A  Rep Notified - N/A  Chest x-ray - 07/15/19 EKG - 07/15/19 Stress Test - denies ECHO - denies Cardiac Cath - denies  Sleep Study - pt did an at home sleep study and was recommended to get a CPAP. Pt states he didn't get one but was fitting for a mouth guard that helps with snoring.  CPAP - denies  Blood Thinner Instructions: N/A Aspirin Instructions:N/A  ERAS Protcol - Pt educated. Clear liquids allowed until 0915 DOS.  PRE-SURGERY Ensure or G2- Pt provided a pre-surgical Ensure.  COVID TEST- Done in PAT today prior to PAT visit. Test pending, pt educated on quarantine after testing.    Anesthesia review: No  Patient denies shortness of breath, fever, cough and chest pain at PAT appointment   All instructions explained to the patient, with a verbal understanding of the material. Patient agrees to go over the instructions while at home for a better understanding. Patient also instructed to self quarantine after being tested for COVID-19. The opportunity to ask questions was provided.    Coronavirus Screening  Have you experienced the following symptoms:  Cough yes/no: No Fever (>100.55F)  yes/no: No Runny nose yes/no: No Sore throat yes/no: No Difficulty breathing/shortness of breath  yes/no: No  Have you or a family member traveled in the last 14 days and where? yes/no: No   If the patient indicates "YES" to the above questions, their PAT will be rescheduled to limit the exposure to others and, the surgeon will be notified. THE PATIENT WILL NEED TO BE ASYMPTOMATIC FOR 14 DAYS.   If the patient is not experiencing any of these symptoms, the PAT nurse will instruct them to NOT bring anyone with them to their appointment since they may have these symptoms or traveled as well.   Please remind your patients and families that hospital visitation restrictions are in effect and the importance of the  restrictions.

## 2019-07-15 NOTE — Pre-Procedure Instructions (Signed)
Jordan Barajas  07/15/2019      [id number] [place], [place]  [address] Phone: [phone] Fax: [phone]    Your procedure is scheduled on Feb. 22  Report to [facility] entrance A at 10:15  A.M.  Call this number if you have problems the morning of surgery:  [phone]   Remember:  Do not eat  after midnight.   You may drink clear liquids until 9:15 A.M.  The morning of surgery.   Clear liquids allowed are:                    Water, Juice (non-citric and without pulp), Carbonated beverages, Clear Tea, Black Coffee only, Plain Jell-O only, Gatorade and Plain Popsicles only                     Enhanced Recovery after Surgery for Orthopedics Enhanced Recovery after Surgery is a protocol used to improve the stress on your body and your recovery after surgery.  Patient Instructions  . The night before surgery:  o No food after midnight. ONLY clear liquids after midnight  .  Marland Kitchen The day of surgery (if you do NOT have diabetes):  o Drink ONE (1) Pre-Surgery Clear Ensure as directed.  ( Drink by 9:15 A.M.) o This drink was given to you during your hospital  pre-op appointment visit. o The pre-op nurse will instruct you on the time to drink the  Pre-Surgery Ensure depending on your surgery time. o Finish the drink at the designated time by the pre-op nurse.  o Nothing else to drink after completing the  Pre-Surgery Clear Ensure.           If you have questions, please contact your surgeon's office.     Take these medicines the morning of surgery with A SIP OF WATER :              Amlodipine (norvasc)             Levothyroxine (synthroid)                         7 days prior to surgery STOP taking any Aspirin (unless otherwise instructed by your surgeon), Aleve, Naproxen, Ibuprofen, Motrin, Advil, Goody's, BC's, all herbal medications, fish oil, and all vitamins. Including: meloxicam (MOBIC).     Do  not wear jewelry.  Do not wear lotions, powders, or colognes, or deodorant.  Men may shave face and neck.  Do not bring valuables to the hospital.  [facility] is not responsible for any belongings or valuables.  Contacts, dentures or bridgework may not be worn into surgery.  Leave your suitcase in the car.  After surgery it may be brought to your room.  For patients admitted to the hospital, discharge time will be determined by your treatment team.  Patients discharged the day of surgery will not be allowed to drive home.    Special instructions:  [facility]- Preparing For Surgery  Before surgery, you can play an important role. Because skin is not sterile, your skin needs to be as free of germs as possible. You can reduce the number of germs on your skin by washing with CHG (chlorahexidine gluconate) Soap before surgery.  CHG is an antiseptic cleaner which kills germs and bonds with the skin to continue killing germs even after washing.    Oral Hygiene  is also important to reduce your risk of infection.  Remember - BRUSH YOUR TEETH THE MORNING OF SURGERY WITH YOUR REGULAR TOOTHPASTE  Please do not use if you have an allergy to CHG or antibacterial soaps. If your skin becomes reddened/irritated stop using the CHG.  Do not shave (including legs and underarms) for at least 48 hours prior to first CHG shower. It is OK to shave your face.  Please follow these instructions carefully.   1. Shower the NIGHT BEFORE SURGERY and the MORNING OF SURGERY with CHG.   2. If you chose to wash your hair, wash your hair first as usual with your normal shampoo.  3. After you shampoo, rinse your hair and body thoroughly to remove the shampoo.  4. Use CHG as you would any other liquid soap. You can apply CHG directly to the skin and wash gently with a scrungie or a clean washcloth.   5. Apply the CHG Soap to your body ONLY FROM THE NECK DOWN.  Do not use on open wounds or open sores. Avoid contact with  your eyes, ears, mouth and genitals (private parts). Wash Face and genitals (private parts)  with your normal soap.  6. Wash thoroughly, paying special attention to the area where your surgery will be performed.  7. Thoroughly rinse your body with warm water from the neck down.  8. DO NOT shower/wash with your normal soap after using and rinsing off the CHG Soap.  9. Pat yourself dry with a CLEAN TOWEL.  10. Wear CLEAN PAJAMAS to bed the night before surgery, wear comfortable clothes the morning of surgery  11. Place CLEAN SHEETS on your bed the night of your first shower and DO NOT SLEEP WITH PETS.    Day of Surgery:  Do not apply any deodorants/lotions.  Please wear clean clothes to the hospital/surgery center.   Remember to brush your teeth WITH YOUR REGULAR TOOTHPASTE.    Please read over the following fact sheets that you were given. Coughing and Deep Breathing, Total Joint Packet and [facility] Site Infection Prevention

## 2019-07-16 MED ORDER — VANCOMYCIN HCL 1500 MG/300ML IV SOLN
1500.0000 mg | INTRAVENOUS | Status: AC
  Administered 2019-07-19: 1500 mg via INTRAVENOUS
  Filled 2019-07-16 (×2): qty 300

## 2019-07-16 MED ORDER — TRANEXAMIC ACID 1000 MG/10ML IV SOLN
2000.0000 mg | INTRAVENOUS | Status: DC
  Filled 2019-07-16 (×2): qty 20

## 2019-07-16 MED ORDER — BUPIVACAINE LIPOSOME 1.3 % IJ SUSP
20.0000 mL | Freq: Once | INTRAMUSCULAR | Status: DC
  Filled 2019-07-16 (×2): qty 20

## 2019-07-18 ENCOUNTER — Other Ambulatory Visit: Payer: Self-pay | Admitting: Orthopaedic Surgery

## 2019-07-18 DIAGNOSIS — M1712 Unilateral primary osteoarthritis, left knee: Secondary | ICD-10-CM

## 2019-07-18 MED ORDER — ASPIRIN EC 81 MG PO TBEC: 81.0000 mg | DELAYED_RELEASE_TABLET | Freq: Two times a day (BID) | ORAL | 0 refills | Status: DC

## 2019-07-18 MED ORDER — METHOCARBAMOL 750 MG PO TABS: 750.0000 mg | ORAL_TABLET | Freq: Two times a day (BID) | ORAL | 3 refills | Status: DC | PRN

## 2019-07-18 MED ORDER — OXYCODONE HCL 5 MG PO TABS: 5.0000 mg | ORAL_TABLET | Freq: Three times a day (TID) | ORAL | 0 refills | Status: DC | PRN

## 2019-07-18 MED ORDER — ONDANSETRON HCL 4 MG PO TABS: 4.0000 mg | ORAL_TABLET | Freq: Three times a day (TID) | ORAL | 0 refills | Status: DC | PRN

## 2019-07-18 MED ORDER — OXYCODONE HCL ER 10 MG PO T12A: 10.0000 mg | EXTENDED_RELEASE_TABLET | Freq: Two times a day (BID) | ORAL | 0 refills | Status: AC

## 2019-07-18 MED ORDER — SULFAMETHOXAZOLE-TRIMETHOPRIM 800-160 MG PO TABS: 1.0000 | ORAL_TABLET | Freq: Two times a day (BID) | ORAL | 0 refills | Status: DC

## 2019-07-19 ENCOUNTER — Encounter (HOSPITAL_COMMUNITY)
Admission: RE | Disposition: A | Discharge status: Home / Self Care | Payer: Self-pay | Source: Home / Self Care | Attending: Orthopaedic Surgery

## 2019-07-19 ENCOUNTER — Encounter (HOSPITAL_COMMUNITY): Payer: Self-pay | Admitting: Orthopaedic Surgery

## 2019-07-19 ENCOUNTER — Ambulatory Visit (HOSPITAL_COMMUNITY): Payer: BC Managed Care – PPO | Admitting: Vascular Surgery

## 2019-07-19 ENCOUNTER — Other Ambulatory Visit: Payer: Self-pay

## 2019-07-19 ENCOUNTER — Observation Stay (HOSPITAL_COMMUNITY): Payer: BC Managed Care – PPO

## 2019-07-19 ENCOUNTER — Observation Stay (HOSPITAL_COMMUNITY)
Admission: RE | Admit: 2019-07-19 | Discharge: 2019-07-21 | Disposition: A | Discharge status: Home / Self Care | Payer: BC Managed Care – PPO | Attending: Orthopaedic Surgery | Admitting: Orthopaedic Surgery

## 2019-07-19 ENCOUNTER — Ambulatory Visit (HOSPITAL_COMMUNITY): Payer: BC Managed Care – PPO | Admitting: Certified Registered Nurse Anesthetist

## 2019-07-19 DIAGNOSIS — Z6832 Body mass index (BMI) 32.0-32.9, adult: Secondary | ICD-10-CM | POA: Insufficient documentation

## 2019-07-19 DIAGNOSIS — M1712 Unilateral primary osteoarthritis, left knee: Secondary | ICD-10-CM | POA: Diagnosis not present

## 2019-07-19 DIAGNOSIS — E039 Hypothyroidism, unspecified: Secondary | ICD-10-CM | POA: Diagnosis not present

## 2019-07-19 DIAGNOSIS — Z96652 Presence of left artificial knee joint: Secondary | ICD-10-CM | POA: Diagnosis not present

## 2019-07-19 DIAGNOSIS — Z79899 Other long term (current) drug therapy: Secondary | ICD-10-CM | POA: Diagnosis not present

## 2019-07-19 DIAGNOSIS — E669 Obesity, unspecified: Secondary | ICD-10-CM | POA: Diagnosis not present

## 2019-07-19 DIAGNOSIS — Z791 Long term (current) use of non-steroidal anti-inflammatories (NSAID): Secondary | ICD-10-CM | POA: Diagnosis not present

## 2019-07-19 DIAGNOSIS — Z7982 Long term (current) use of aspirin: Secondary | ICD-10-CM | POA: Diagnosis not present

## 2019-07-19 DIAGNOSIS — I10 Essential (primary) hypertension: Secondary | ICD-10-CM | POA: Diagnosis not present

## 2019-07-19 DIAGNOSIS — M25762 Osteophyte, left knee: Secondary | ICD-10-CM | POA: Diagnosis not present

## 2019-07-19 DIAGNOSIS — G8918 Other acute postprocedural pain: Secondary | ICD-10-CM | POA: Diagnosis not present

## 2019-07-19 DIAGNOSIS — Z471 Aftercare following joint replacement surgery: Secondary | ICD-10-CM | POA: Diagnosis not present

## 2019-07-19 HISTORY — PX: TOTAL KNEE ARTHROPLASTY: SHX125

## 2019-07-19 SURGERY — ARTHROPLASTY, KNEE, TOTAL
Anesthesia: Spinal | Site: Knee | Laterality: Left

## 2019-07-19 MED ORDER — EPINEPHRINE PF 1 MG/ML IJ SOLN
INTRAMUSCULAR | Status: AC
  Filled 2019-07-19: qty 1

## 2019-07-19 MED ORDER — OXYCODONE HCL ER 10 MG PO T12A
10.0000 mg | EXTENDED_RELEASE_TABLET | Freq: Two times a day (BID) | ORAL | Status: DC
  Administered 2019-07-19 – 2019-07-21 (×4): 10 mg via ORAL
  Filled 2019-07-19 (×4): qty 1

## 2019-07-19 MED ORDER — LACTATED RINGERS IV SOLN
INTRAVENOUS | Status: DC
  Administered 2019-07-19: 11:00:00 100 mL/h via INTRAVENOUS

## 2019-07-19 MED ORDER — CELECOXIB 200 MG PO CAPS
200.0000 mg | ORAL_CAPSULE | Freq: Two times a day (BID) | ORAL | Status: DC
  Administered 2019-07-20 – 2019-07-21 (×2): 200 mg via ORAL
  Filled 2019-07-19 (×3): qty 1

## 2019-07-19 MED ORDER — 0.9 % SODIUM CHLORIDE (POUR BTL) OPTIME
TOPICAL | Status: DC | PRN
  Administered 2019-07-19: 1000 mL

## 2019-07-19 MED ORDER — TRANEXAMIC ACID-NACL 1000-0.7 MG/100ML-% IV SOLN
1000.0000 mg | INTRAVENOUS | Status: AC
  Administered 2019-07-19: 1000 mg via INTRAVENOUS
  Filled 2019-07-19: qty 100

## 2019-07-19 MED ORDER — BUPIVACAINE IN DEXTROSE 0.75-8.25 % IT SOLN
INTRATHECAL | Status: DC | PRN
  Administered 2019-07-19: 1.6 mL via INTRATHECAL

## 2019-07-19 MED ORDER — OXYCODONE HCL 5 MG PO TABS: 5.0000 mg | ORAL_TABLET | ORAL | Status: DC | PRN

## 2019-07-19 MED ORDER — LEVOTHYROXINE SODIUM 25 MCG PO TABS
125.0000 ug | ORAL_TABLET | Freq: Every day | ORAL | Status: DC
  Administered 2019-07-20 – 2019-07-21 (×2): 125 ug via ORAL
  Filled 2019-07-19 (×2): qty 1

## 2019-07-19 MED ORDER — CHLORHEXIDINE GLUCONATE 4 % EX LIQD: 60.0000 mL | Freq: Once | CUTANEOUS | Status: DC

## 2019-07-19 MED ORDER — POVIDONE-IODINE 10 % EX SWAB: 2.0000 "application " | Freq: Once | CUTANEOUS | Status: DC

## 2019-07-19 MED ORDER — ONDANSETRON HCL 4 MG/2ML IJ SOLN
INTRAMUSCULAR | Status: DC | PRN
  Administered 2019-07-19: 4 mg via INTRAVENOUS

## 2019-07-19 MED ORDER — HYDROMORPHONE HCL 1 MG/ML IJ SOLN
0.5000 mg | INTRAMUSCULAR | Status: DC | PRN
  Administered 2019-07-19 – 2019-07-20 (×2): 1 mg via INTRAVENOUS
  Filled 2019-07-19 (×2): qty 1

## 2019-07-19 MED ORDER — TRANEXAMIC ACID 1000 MG/10ML IV SOLN
INTRAVENOUS | Status: DC | PRN
  Administered 2019-07-19: 2000 mg via TOPICAL

## 2019-07-19 MED ORDER — ONDANSETRON HCL 4 MG/2ML IJ SOLN: 4.0000 mg | Freq: Once | INTRAMUSCULAR | Status: DC | PRN

## 2019-07-19 MED ORDER — IRRISEPT - 450ML BOTTLE WITH 0.05% CHG IN STERILE WATER, USP 99.95% OPTIME
TOPICAL | Status: DC | PRN
  Administered 2019-07-19: 450 mL

## 2019-07-19 MED ORDER — CELECOXIB 200 MG PO CAPS
200.0000 mg | ORAL_CAPSULE | Freq: Two times a day (BID) | ORAL | Status: DC
  Filled 2019-07-19: qty 1

## 2019-07-19 MED ORDER — METHOCARBAMOL 1000 MG/10ML IJ SOLN
500.0000 mg | Freq: Four times a day (QID) | INTRAVENOUS | Status: DC | PRN
  Filled 2019-07-19: qty 5

## 2019-07-19 MED ORDER — SORBITOL 70 % SOLN: 30.0000 mL | Freq: Every day | Status: DC | PRN

## 2019-07-19 MED ORDER — DOCUSATE SODIUM 100 MG PO CAPS
100.0000 mg | ORAL_CAPSULE | Freq: Two times a day (BID) | ORAL | Status: DC
  Administered 2019-07-19 – 2019-07-21 (×4): 100 mg via ORAL
  Filled 2019-07-19 (×4): qty 1

## 2019-07-19 MED ORDER — CEFAZOLIN SODIUM-DEXTROSE 2-4 GM/100ML-% IV SOLN
2.0000 g | Freq: Four times a day (QID) | INTRAVENOUS | Status: AC
  Administered 2019-07-19 – 2019-07-20 (×3): 2 g via INTRAVENOUS
  Filled 2019-07-19 (×3): qty 100

## 2019-07-19 MED ORDER — ONDANSETRON HCL 4 MG/2ML IJ SOLN: 4.0000 mg | Freq: Four times a day (QID) | INTRAMUSCULAR | Status: DC | PRN

## 2019-07-19 MED ORDER — PROPOFOL 10 MG/ML IV BOLUS
INTRAVENOUS | Status: AC
  Filled 2019-07-19: qty 20

## 2019-07-19 MED ORDER — OXYCODONE HCL 5 MG/5ML PO SOLN: 5.0000 mg | Freq: Once | ORAL | Status: AC | PRN

## 2019-07-19 MED ORDER — METOCLOPRAMIDE HCL 5 MG PO TABS: 5.0000 mg | ORAL_TABLET | Freq: Three times a day (TID) | ORAL | Status: DC | PRN

## 2019-07-19 MED ORDER — FENTANYL CITRATE (PF) 100 MCG/2ML IJ SOLN
INTRAMUSCULAR | Status: AC
  Administered 2019-07-19: 12:00:00 50 ug via INTRAVENOUS
  Filled 2019-07-19: qty 2

## 2019-07-19 MED ORDER — OXYCODONE HCL 5 MG PO TABS
10.0000 mg | ORAL_TABLET | ORAL | Status: DC | PRN
  Administered 2019-07-21: 15 mg via ORAL
  Filled 2019-07-19: qty 3

## 2019-07-19 MED ORDER — PHENOL 1.4 % MT LIQD: 1.0000 | OROMUCOSAL | Status: DC | PRN

## 2019-07-19 MED ORDER — ROPIVACAINE HCL 5 MG/ML IJ SOLN
INTRAMUSCULAR | Status: DC | PRN
  Administered 2019-07-19: 30 mL via PERINEURAL

## 2019-07-19 MED ORDER — MAGNESIUM CITRATE PO SOLN: 1.0000 | Freq: Once | ORAL | Status: DC | PRN

## 2019-07-19 MED ORDER — PROPOFOL 10 MG/ML IV BOLUS
INTRAVENOUS | Status: DC | PRN
  Administered 2019-07-19 (×4): 20 mg via INTRAVENOUS

## 2019-07-19 MED ORDER — PHENYLEPHRINE 40 MCG/ML (10ML) SYRINGE FOR IV PUSH (FOR BLOOD PRESSURE SUPPORT)
PREFILLED_SYRINGE | INTRAVENOUS | Status: AC
  Filled 2019-07-19: qty 10

## 2019-07-19 MED ORDER — BUPIVACAINE-EPINEPHRINE 0.25% -1:200000 IJ SOLN
INTRAMUSCULAR | Status: DC | PRN
  Administered 2019-07-19: 20 mL

## 2019-07-19 MED ORDER — KETOROLAC TROMETHAMINE 15 MG/ML IJ SOLN
30.0000 mg | Freq: Four times a day (QID) | INTRAMUSCULAR | Status: AC
  Administered 2019-07-19 – 2019-07-20 (×4): 30 mg via INTRAVENOUS
  Filled 2019-07-19 (×4): qty 2

## 2019-07-19 MED ORDER — FENTANYL CITRATE (PF) 100 MCG/2ML IJ SOLN
25.0000 ug | INTRAMUSCULAR | Status: DC | PRN
  Administered 2019-07-19: 50 ug via INTRAVENOUS

## 2019-07-19 MED ORDER — SODIUM CHLORIDE 0.9% FLUSH
INTRAVENOUS | Status: DC | PRN
  Administered 2019-07-19: 20 mL

## 2019-07-19 MED ORDER — TRANEXAMIC ACID-NACL 1000-0.7 MG/100ML-% IV SOLN
1000.0000 mg | Freq: Once | INTRAVENOUS | Status: DC
  Filled 2019-07-19: qty 100

## 2019-07-19 MED ORDER — PHENYLEPHRINE HCL (PRESSORS) 10 MG/ML IV SOLN
INTRAVENOUS | Status: DC | PRN
  Administered 2019-07-19 (×5): 80 ug via INTRAVENOUS

## 2019-07-19 MED ORDER — OXYCODONE HCL 5 MG PO TABS
5.0000 mg | ORAL_TABLET | Freq: Once | ORAL | Status: AC | PRN
  Administered 2019-07-19: 5 mg via ORAL

## 2019-07-19 MED ORDER — METHOCARBAMOL 500 MG PO TABS
500.0000 mg | ORAL_TABLET | Freq: Four times a day (QID) | ORAL | Status: DC | PRN
  Administered 2019-07-20 – 2019-07-21 (×2): 500 mg via ORAL
  Filled 2019-07-19 (×2): qty 1

## 2019-07-19 MED ORDER — FENTANYL CITRATE (PF) 100 MCG/2ML IJ SOLN
INTRAMUSCULAR | Status: AC
  Filled 2019-07-19: qty 2

## 2019-07-19 MED ORDER — VANCOMYCIN HCL 1000 MG IV SOLR
INTRAVENOUS | Status: DC | PRN
  Administered 2019-07-19: 1000 mg

## 2019-07-19 MED ORDER — MIDAZOLAM HCL 2 MG/2ML IJ SOLN: 2.0000 mg | Freq: Once | INTRAMUSCULAR | Status: AC

## 2019-07-19 MED ORDER — CEFAZOLIN SODIUM-DEXTROSE 2-4 GM/100ML-% IV SOLN: 2.0000 g | INTRAVENOUS | Status: DC

## 2019-07-19 MED ORDER — OXYCODONE HCL 5 MG PO TABS
ORAL_TABLET | ORAL | Status: AC
  Administered 2019-07-20: 11:00:00 10 mg via ORAL
  Filled 2019-07-19: qty 1

## 2019-07-19 MED ORDER — ALUM & MAG HYDROXIDE-SIMETH 200-200-20 MG/5ML PO SUSP: 30.0000 mL | ORAL | Status: DC | PRN

## 2019-07-19 MED ORDER — POLYETHYLENE GLYCOL 3350 17 G PO PACK: 17.0000 g | PACK | Freq: Every day | ORAL | Status: DC | PRN

## 2019-07-19 MED ORDER — BUPIVACAINE HCL (PF) 0.25 % IJ SOLN
INTRAMUSCULAR | Status: AC
  Filled 2019-07-19: qty 30

## 2019-07-19 MED ORDER — DIPHENHYDRAMINE HCL 12.5 MG/5ML PO ELIX: 25.0000 mg | ORAL_SOLUTION | ORAL | Status: DC | PRN

## 2019-07-19 MED ORDER — MENTHOL 3 MG MT LOZG: 1.0000 | LOZENGE | OROMUCOSAL | Status: DC | PRN

## 2019-07-19 MED ORDER — SODIUM CHLORIDE 0.9 % IR SOLN
Status: DC | PRN
  Administered 2019-07-19: 3000 mL

## 2019-07-19 MED ORDER — FENTANYL CITRATE (PF) 250 MCG/5ML IJ SOLN
INTRAMUSCULAR | Status: AC
  Filled 2019-07-19: qty 5

## 2019-07-19 MED ORDER — ACETAMINOPHEN 500 MG PO TABS
1000.0000 mg | ORAL_TABLET | Freq: Four times a day (QID) | ORAL | Status: AC
  Administered 2019-07-19 – 2019-07-20 (×4): 1000 mg via ORAL
  Filled 2019-07-19 (×4): qty 2

## 2019-07-19 MED ORDER — PROPOFOL 500 MG/50ML IV EMUL
INTRAVENOUS | Status: DC | PRN
  Administered 2019-07-19: 100 ug/kg/min via INTRAVENOUS

## 2019-07-19 MED ORDER — MIDAZOLAM HCL 2 MG/2ML IJ SOLN
INTRAMUSCULAR | Status: AC
  Filled 2019-07-19: qty 2

## 2019-07-19 MED ORDER — AMLODIPINE BESYLATE 2.5 MG PO TABS
2.5000 mg | ORAL_TABLET | Freq: Every day | ORAL | Status: DC
  Administered 2019-07-20 – 2019-07-21 (×2): 2.5 mg via ORAL
  Filled 2019-07-19 (×2): qty 1

## 2019-07-19 MED ORDER — METOCLOPRAMIDE HCL 5 MG/ML IJ SOLN: 5.0000 mg | Freq: Three times a day (TID) | INTRAMUSCULAR | Status: DC | PRN

## 2019-07-19 MED ORDER — GABAPENTIN 300 MG PO CAPS
300.0000 mg | ORAL_CAPSULE | Freq: Three times a day (TID) | ORAL | Status: DC
  Administered 2019-07-19 – 2019-07-21 (×5): 300 mg via ORAL
  Filled 2019-07-19 (×5): qty 1

## 2019-07-19 MED ORDER — ACETAMINOPHEN 325 MG PO TABS: 325.0000 mg | ORAL_TABLET | Freq: Four times a day (QID) | ORAL | Status: DC | PRN

## 2019-07-19 MED ORDER — DEXAMETHASONE SODIUM PHOSPHATE 10 MG/ML IJ SOLN
10.0000 mg | Freq: Once | INTRAMUSCULAR | Status: AC
  Administered 2019-07-20: 10 mg via INTRAVENOUS
  Filled 2019-07-19: qty 1

## 2019-07-19 MED ORDER — ONDANSETRON HCL 4 MG PO TABS: 4.0000 mg | ORAL_TABLET | Freq: Four times a day (QID) | ORAL | Status: DC | PRN

## 2019-07-19 MED ORDER — MIDAZOLAM HCL 2 MG/2ML IJ SOLN
INTRAMUSCULAR | Status: AC
  Administered 2019-07-19: 2 mg via INTRAVENOUS
  Filled 2019-07-19: qty 2

## 2019-07-19 MED ORDER — SODIUM CHLORIDE 0.9 % IV SOLN: INTRAVENOUS | Status: DC

## 2019-07-19 MED ORDER — ASPIRIN 81 MG PO CHEW
81.0000 mg | CHEWABLE_TABLET | Freq: Two times a day (BID) | ORAL | Status: DC
  Administered 2019-07-19 – 2019-07-21 (×4): 81 mg via ORAL
  Filled 2019-07-19 (×4): qty 1

## 2019-07-19 MED ORDER — BUPIVACAINE LIPOSOME 1.3 % IJ SUSP
INTRAMUSCULAR | Status: DC | PRN
  Administered 2019-07-19: 20 mL

## 2019-07-19 MED ORDER — FENTANYL CITRATE (PF) 100 MCG/2ML IJ SOLN: 50.0000 ug | Freq: Once | INTRAMUSCULAR | Status: AC

## 2019-07-19 SURGICAL SUPPLY — 88 items
ADH SKN CLS APL DERMABOND .7 (GAUZE/BANDAGES/DRESSINGS) ×1
ADH SKN CLS LQ APL DERMABOND (GAUZE/BANDAGES/DRESSINGS) ×1
ALCOHOL 70% 16 OZ (MISCELLANEOUS) ×2 IMPLANT
BAG DECANTER FOR FLEXI CONT (MISCELLANEOUS) ×2 IMPLANT
BANDAGE ESMARK 6X9 LF (GAUZE/BANDAGES/DRESSINGS) ×1 IMPLANT
BLADE SAG 18X100X1.27 (BLADE) ×2 IMPLANT
BNDG CMPR 9X6 STRL LF SNTH (GAUZE/BANDAGES/DRESSINGS) ×1
BNDG CMPR MED 10X6 ELC LF (GAUZE/BANDAGES/DRESSINGS) ×1
BNDG ELASTIC 6X10 VLCR STRL LF (GAUZE/BANDAGES/DRESSINGS) ×2 IMPLANT
BNDG ESMARK 6X9 LF (GAUZE/BANDAGES/DRESSINGS) ×2
BOWL SMART MIX CTS (DISPOSABLE) ×2 IMPLANT
BSPLAT TIB 5 KN TRITANIUM (Knees) ×1 IMPLANT
CLSR STERI-STRIP ANTIMIC 1/2X4 (GAUZE/BANDAGES/DRESSINGS) ×4 IMPLANT
COMPONENT TRI CR RETAIN SZ6 LT (Orthopedic Implant) IMPLANT
COVER SURGICAL LIGHT HANDLE (MISCELLANEOUS) ×2 IMPLANT
COVER WAND RF STERILE (DRAPES) ×2 IMPLANT
CUFF TOURN SGL QUICK 34 (TOURNIQUET CUFF) ×2
CUFF TOURN SGL QUICK 42 (TOURNIQUET CUFF) IMPLANT
CUFF TRNQT CYL 34X4.125X (TOURNIQUET CUFF) ×1 IMPLANT
DERMABOND ADHESIVE PROPEN (GAUZE/BANDAGES/DRESSINGS) ×1
DERMABOND ADVANCED (GAUZE/BANDAGES/DRESSINGS) ×1
DERMABOND ADVANCED .7 DNX12 (GAUZE/BANDAGES/DRESSINGS) ×1 IMPLANT
DERMABOND ADVANCED .7 DNX6 (GAUZE/BANDAGES/DRESSINGS) IMPLANT
DRAPE EXTREMITY T 121X128X90 (DISPOSABLE) ×2 IMPLANT
DRAPE HALF SHEET 40X57 (DRAPES) ×4 IMPLANT
DRAPE INCISE IOBAN 66X45 STRL (DRAPES) IMPLANT
DRAPE ORTHO SPLIT 77X108 STRL (DRAPES) ×4
DRAPE POUCH INSTRU U-SHP 10X18 (DRAPES) ×2 IMPLANT
DRAPE SURG ORHT 6 SPLT 77X108 (DRAPES) ×2 IMPLANT
DRAPE U-SHAPE 47X51 STRL (DRAPES) ×4 IMPLANT
DRSG AQUACEL AG ADV 3.5X10 (GAUZE/BANDAGES/DRESSINGS) ×1 IMPLANT
DRSG PAD ABDOMINAL 8X10 ST (GAUZE/BANDAGES/DRESSINGS) ×4 IMPLANT
DURAPREP 26ML APPLICATOR (WOUND CARE) ×6 IMPLANT
ELECT CAUTERY BLADE 6.4 (BLADE) ×2 IMPLANT
ELECT REM PT RETURN 9FT ADLT (ELECTROSURGICAL) ×2
ELECTRODE REM PT RTRN 9FT ADLT (ELECTROSURGICAL) ×1 IMPLANT
GAUZE SPONGE 4X4 12PLY STRL (GAUZE/BANDAGES/DRESSINGS) ×2 IMPLANT
GLOVE BIOGEL PI IND STRL 7.0 (GLOVE) ×1 IMPLANT
GLOVE BIOGEL PI INDICATOR 7.0 (GLOVE) ×1
GLOVE ECLIPSE 7.0 STRL STRAW (GLOVE) ×6 IMPLANT
GLOVE SKINSENSE NS SZ7.5 (GLOVE) ×3
GLOVE SKINSENSE STRL SZ7.5 (GLOVE) ×3 IMPLANT
GLOVE SURG SYN 7.5  E (GLOVE) ×4
GLOVE SURG SYN 7.5 E (GLOVE) ×4 IMPLANT
GLOVE SURG SYN 7.5 PF PI (GLOVE) ×4 IMPLANT
GOWN STRL REIN XL XLG (GOWN DISPOSABLE) ×2 IMPLANT
GOWN STRL REUS W/ TWL LRG LVL3 (GOWN DISPOSABLE) ×1 IMPLANT
GOWN STRL REUS W/TWL LRG LVL3 (GOWN DISPOSABLE) ×2
HANDPIECE INTERPULSE COAX TIP (DISPOSABLE) ×2
HOOD PEEL AWAY FLYTE STAYCOOL (MISCELLANEOUS) ×4 IMPLANT
INSERT TIBIAL BEARING SZ5 13MM (Insert) ×1 IMPLANT
JET LAVAGE IRRISEPT WOUND (IRRIGATION / IRRIGATOR) ×2
KIT BASIN OR (CUSTOM PROCEDURE TRAY) ×2 IMPLANT
KIT TURNOVER KIT B (KITS) ×2 IMPLANT
KNEE PATELLA ASYMMETRIC 10X35 (Knees) ×1 IMPLANT
KNEE TIBIAL COMPONENT SZ5 (Knees) ×1 IMPLANT
LAVAGE JET IRRISEPT WOUND (IRRIGATION / IRRIGATOR) IMPLANT
MANIFOLD NEPTUNE II (INSTRUMENTS) ×2 IMPLANT
MARKER SKIN DUAL TIP RULER LAB (MISCELLANEOUS) ×2 IMPLANT
NDL SPNL 18GX3.5 QUINCKE PK (NEEDLE) ×2 IMPLANT
NEEDLE SPNL 18GX3.5 QUINCKE PK (NEEDLE) ×4 IMPLANT
NS IRRIG 1000ML POUR BTL (IV SOLUTION) ×2 IMPLANT
PACK TOTAL JOINT (CUSTOM PROCEDURE TRAY) ×2 IMPLANT
PAD ARMBOARD 7.5X6 YLW CONV (MISCELLANEOUS) ×4 IMPLANT
PAD CAST 4YDX4 CTTN HI CHSV (CAST SUPPLIES) ×1 IMPLANT
PADDING CAST COTTON 4X4 STRL (CAST SUPPLIES) ×2
PIN FLUTED HEDLESS FIX 3.5X1/8 (PIN) ×1 IMPLANT
SAW OSC TIP CART 19.5X105X1.3 (SAW) ×2 IMPLANT
SET HNDPC FAN SPRY TIP SCT (DISPOSABLE) ×1 IMPLANT
STAPLER VISISTAT 35W (STAPLE) IMPLANT
SUCTION FRAZIER HANDLE 10FR (MISCELLANEOUS) ×1
SUCTION TUBE FRAZIER 10FR DISP (MISCELLANEOUS) ×1 IMPLANT
SUT ETHILON 2 0 FS 18 (SUTURE) ×3 IMPLANT
SUT MNCRL AB 4-0 PS2 18 (SUTURE) IMPLANT
SUT VIC AB 0 CT1 27 (SUTURE) ×4
SUT VIC AB 0 CT1 27XBRD ANBCTR (SUTURE) ×2 IMPLANT
SUT VIC AB 1 CT1 36 (SUTURE) ×1 IMPLANT
SUT VIC AB 1 CTX 27 (SUTURE) ×6 IMPLANT
SUT VIC AB 2-0 CT1 27 (SUTURE) ×8
SUT VIC AB 2-0 CT1 TAPERPNT 27 (SUTURE) ×4 IMPLANT
SYRINGE REG LEUR 60CC (SYRINGE) ×4 IMPLANT
TOWEL GREEN STERILE (TOWEL DISPOSABLE) ×2 IMPLANT
TOWEL GREEN STERILE FF (TOWEL DISPOSABLE) ×2 IMPLANT
TRAY CATH 16FR W/PLASTIC CATH (SET/KITS/TRAYS/PACK) IMPLANT
TRAY FOLEY W/BAG SLVR 14FR (SET/KITS/TRAYS/PACK) ×1 IMPLANT
TRIATH CRUCIATE RETAIN SZ6 KNE (Orthopedic Implant) ×2 IMPLANT
UNDERPAD 30X30 (UNDERPADS AND DIAPERS) ×2 IMPLANT
WRAP KNEE MAXI GEL POST OP (GAUZE/BANDAGES/DRESSINGS) ×2 IMPLANT

## 2019-07-19 NOTE — Anesthesia Procedure Notes (Signed)
Anesthesia Regional Block: Adductor canal block   Pre-Anesthetic Checklist: ,, timeout performed, Correct Patient, Correct Site, Correct Laterality, Correct Procedure, Correct Position, site marked, Risks and benefits discussed,  Surgical consent,  Pre-op evaluation,  At surgeon's request and post-op pain management  Laterality: Left  Prep: chloraprep       Needles:  Injection technique: Single-shot  Needle Type: Echogenic Stimulator Needle     Needle Length: 10cm  Needle Gauge: 21     Additional Needles:   Procedures:,,,, ultrasound used (permanent image in chart),,,,  Narrative:  Start time: 07/19/2019 12:04 PM End time: 07/19/2019 12:07 PM Injection made incrementally with aspirations every 5 mL.  Performed by: Personally  Anesthesiologist: Lucretia Kern, MD  Additional Notes: Monitors applied. Injection made in 5cc increments. No resistance to injection. Good needle visualization. Patient tolerated procedure well.

## 2019-07-19 NOTE — Anesthesia Preprocedure Evaluation (Addendum)
Anesthesia Evaluation  Patient identified by MRN, date of birth, ID band Patient awake    Reviewed: Allergy & Precautions, NPO status , Patient's Chart, lab work & pertinent test results  History of Anesthesia Complications Negative for: history of anesthetic complications  Airway Mallampati: II  TM Distance: >3 FB Neck ROM: Full    Dental  (+) Teeth Intact   Pulmonary neg pulmonary ROS,    Pulmonary exam normal        Cardiovascular hypertension, Normal cardiovascular exam     Neuro/Psych negative neurological ROS  negative psych ROS   GI/Hepatic negative GI ROS, Neg liver ROS,   Endo/Other  Hypothyroidism   Renal/GU Renal InsufficiencyRenal disease (Cr 1.45)  negative genitourinary   Musculoskeletal  (+) Arthritis , Osteoarthritis,    Abdominal   Peds  Hematology negative hematology ROS (+)   Anesthesia Other Findings   Reproductive/Obstetrics                           Anesthesia Physical Anesthesia Plan  ASA: II  Anesthesia Plan: Spinal   Post-op Pain Management:    Induction:   PONV Risk Score and Plan: 1 and Propofol infusion, Treatment may vary due to age or medical condition, Ondansetron and TIVA  Airway Management Planned: Nasal Cannula and Simple Face Mask  Additional Equipment: None  Intra-op Plan:   Post-operative Plan:   Informed Consent: I have reviewed the patients History and Physical, chart, labs and discussed the procedure including the risks, benefits and alternatives for the proposed anesthesia with the patient or authorized representative who has indicated his/her understanding and acceptance.       Plan Discussed with:   Anesthesia Plan Comments:       Anesthesia Quick Evaluation

## 2019-07-19 NOTE — Plan of Care (Signed)

## 2019-07-19 NOTE — Anesthesia Procedure Notes (Signed)
Procedure Name: MAC Date/Time: 07/19/2019 12:35 PM Performed by: Amadeo Garnet, CRNA Pre-anesthesia Checklist: Patient identified, Emergency Drugs available, Suction available and Patient being monitored Patient Re-evaluated:Patient Re-evaluated prior to induction Oxygen Delivery Method: Nasal cannula Preoxygenation: Pre-oxygenation with 100% oxygen Induction Type: IV induction Placement Confirmation: positive ETCO2 Dental Injury: Teeth and Oropharynx as per pre-operative assessment

## 2019-07-19 NOTE — H&P (Signed)
PREOPERATIVE H&P  Chief Complaint: left knee osteoarthritis  HPI: [facility] Barajas is a 64 y.o. male who presents for surgical treatment of left knee osteoarthritis.  He denies any changes in medical history.  Past Medical History:  Diagnosis Date  . Arthritis   . Hyperthyroidism 02/01/2016   Past Surgical History:  Procedure Laterality Date  . APPENDECTOMY     as a child   Social History   Socioeconomic History  . Marital status: Married    Spouse name: Not on file  . Number of children: Not on file  . Years of education: Not on file  . Highest education level: Not on file  Occupational History  . Not on file  Tobacco Use  . Smoking status: Never Smoker  . Smokeless tobacco: Never Used  Substance and Sexual Activity  . Alcohol use: No  . Drug use: No  . Sexual activity: Not on file  Other Topics Concern  . Not on file  Social History Narrative  . Not on file   Social Determinants of Health   Financial Resource Strain:   . Difficulty of Paying Living Expenses: Not on file  Food Insecurity:   . Worried About Charity fundraiser in the Last Year: Not on file  . Ran Out of Food in the Last Year: Not on file  Transportation Needs:   . Lack of Transportation (Medical): Not on file  . Lack of Transportation (Non-Medical): Not on file  Physical Activity:   . Days of Exercise per Week: Not on file  . Minutes of Exercise per Session: Not on file  Stress:   . Feeling of Stress : Not on file  Social Connections:   . Frequency of Communication with Friends and Family: Not on file  . Frequency of Social Gatherings with Friends and Family: Not on file  . Attends Religious Services: Not on file  . Active Member of Clubs or Organizations: Not on file  . Attends Archivist Meetings: Not on file  . Marital Status: Not on file   Family History  Problem Relation Age of Onset  . Thyroid disease Neg Hx    Allergies  Allergen Reactions  . Fish Allergy Hives and  Swelling   Prior to Admission medications   Medication Sig Start Date End Date Taking? Authorizing Provider  amLODipine (NORVASC) 2.5 MG tablet Take 2.5 mg by mouth daily. 07/10/19  Yes [provider]  levothyroxine (SYNTHROID, LEVOTHROID) 150 MCG tablet Take 1 tablet (150 mcg total) by mouth daily before breakfast. Patient taking differently: Take 125 mcg by mouth daily before breakfast.  05/14/17  Yes Renato Shin, MD  meloxicam (MOBIC) 15 MG tablet Take 15 mg by mouth daily as needed for pain.  04/28/17  Yes [provider]  aspirin EC 81 MG tablet Take 1 tablet (81 mg total) by mouth 2 (two) times daily. 07/18/19   Leandrew Koyanagi, MD  diclofenac Sodium (VOLTAREN) 1 % GEL Apply 2 g topically 4 (four) times daily. Patient not taking: Reported on 07/12/2019 04/07/19   Aundra Dubin, PA-C  levothyroxine (SYNTHROID, LEVOTHROID) 100 MCG tablet TAKE 1 TABLET (100 MCG TOTAL) BY MOUTH DAILY BEFORE BREAKFAST. Patient not taking: Reported on 07/12/2019 08/26/16   Renato Shin, MD  methocarbamol (ROBAXIN) 750 MG tablet Take 1 tablet (750 mg total) by mouth 2 (two) times daily as needed for muscle spasms. 07/18/19   Leandrew Koyanagi, MD  ondansetron (ZOFRAN) 4 MG tablet Take  1-2 tablets (4-8 mg total) by mouth every 8 (eight) hours as needed for nausea or vomiting. 07/18/19   Tarry Kos, MD  oxyCODONE (OXY IR/ROXICODONE) 5 MG immediate release tablet Take 1-3 tablets (5-15 mg total) by mouth every 8 (eight) hours as needed for severe pain. 07/18/19   Tarry Kos, MD  oxyCODONE (OXYCONTIN) 10 mg 12 hr tablet Take 1 tablet (10 mg total) by mouth every 12 (twelve) hours for 3 days. 07/18/19 07/21/19  Tarry Kos, MD  sulfamethoxazole-trimethoprim (BACTRIM DS) 800-160 MG tablet Take 1 tablet by mouth 2 (two) times daily. 07/18/19   Tarry Kos, MD     Positive ROS: All other systems have been reviewed and were otherwise negative with the exception of those mentioned in the HPI and as  above.  Physical Exam: General: Alert, no acute distress Cardiovascular: No pedal edema Respiratory: No cyanosis, no use of accessory musculature GI: abdomen soft Skin: No lesions in the area of chief complaint Neurologic: Sensation intact distally Psychiatric: Patient is competent for consent with normal mood and affect Lymphatic: no lymphedema  MUSCULOSKELETAL: exam stable  Assessment: left knee osteoarthritis  Plan: Plan for Procedure(s): LEFT TOTAL KNEE ARTHROPLASTY  The risks benefits and alternatives were discussed with the patient including but not limited to the risks of nonoperative treatment, versus surgical intervention including infection, bleeding, nerve injury,  blood clots, cardiopulmonary complications, morbidity, mortality, among others, and they were willing to proceed.   Preoperative templating of the joint replacement has been completed, documented, and submitted to the Operating Room personnel in order to optimize intra-operative equipment management.  Anticipated LOS equal to or greater than 2 midnights due to - Age 80 and older with one or more of the following:  - Obesity  - Expected need for hospital services (PT, OT, Nursing) required for safe  discharge  - Anticipated need for postoperative skilled nursing care or inpatient rehab  - Active co-morbidities: None  Glee Arvin, MD   07/19/2019 7:01 AM

## 2019-07-19 NOTE — Progress Notes (Signed)
Orthopedic Tech Progress Note Patient Details:  [facility]ajas 06/28/1955 [id number]  CPM Left Knee CPM Left Knee: On Left Knee Flexion (Degrees): 0 Left Knee Extension (Degrees): 90 Additional Comments: added ice  Post Interventions Patient Tolerated: Well Instructions Provided: Care of device, Adjustment of device  Donald Pore 07/19/2019, 3:26 PM

## 2019-07-19 NOTE — Transfer of Care (Signed)
Immediate Anesthesia Transfer of Care Note  Patient: [facility] Barajas  Procedure(s) Performed: LEFT TOTAL KNEE ARTHROPLASTY (Left Knee)  Patient Location: PACU  Anesthesia Type:MAC combined with regional for post-op pain  Level of Consciousness: awake, alert  and oriented  Airway & Oxygen Therapy: Patient Spontanous Breathing and Patient connected to face mask oxygen  Post-op Assessment: Report given to RN, Post -op Vital signs reviewed and stable and Patient moving all extremities  Post vital signs: Reviewed and stable  Last Vitals:  Vitals Value Taken Time  BP 116/71 07/19/19 1505  Temp 36.4 C 07/19/19 1500  Pulse 63 07/19/19 1508  Resp 14 07/19/19 1508  SpO2 98 % 07/19/19 1508  Vitals shown include unvalidated device data.  Last Pain:  Vitals:   07/19/19 1500  TempSrc:   PainSc: 0-No pain      Patients Stated Pain Goal: 2 (17/35/67 0141)  Complications: No apparent anesthesia complications

## 2019-07-19 NOTE — Op Note (Signed)
Total Knee Arthroplasty Procedure Note  Preoperative diagnosis: Left knee osteoarthritis  Postoperative diagnosis:same  Operative procedure: Left total knee arthroplasty. CPT [phone]  Surgeon: N. Glee Arvin, MD  Assist: Oneal Grout, PA-C; necessary for the timely completion of procedure and due to complexity of procedure.  Anesthesia: Spinal, regional  Tourniquet time: 45 mins  Implants used: [employer] Triatholon pressfit Femur: CR 6 Tibia: 5 Patella: 35 mm Polyethylene: 13 mm, CS  Indication: [facility]ajas is a 64 y.o. year old male with a history of knee pain. Having failed conservative management, the patient elected to proceed with a total knee arthroplasty.  We have reviewed the risk and benefits of the surgery and they elected to proceed after voicing understanding.  Procedure:  After informed consent was obtained and understanding of the risk were voiced including but not limited to bleeding, infection, damage to surrounding structures including nerves and vessels, blood clots, leg length inequality and the failure to achieve desired results, the operative extremity was marked with verbal confirmation of the patient in the holding area.   The patient was then brought to the operating room and transported to the operating room table in the supine position.  A tourniquet was applied to the operative extremity around the upper thigh. The operative limb was then prepped and draped in the usual sterile fashion and preoperative antibiotics were administered.  A time out was performed prior to the start of surgery confirming the correct extremity, preoperative antibiotic administration, as well as team members, implants and instruments available for the case. Correct surgical site was also confirmed with preoperative radiographs. The limb was then elevated for exsanguination and the tourniquet was inflated. A midline incision was made and a standard medial parapatellar approach  was performed.  The patella was prepared and sized to a 35 mm.  A cover was placed on the patella for protection from retractors.  We then turned our attention to the femur. Posterior cruciate ligament was sacrificed. Start site was drilled in the femur and the intramedullary distal femoral cutting guide was placed, set at 5 degrees valgus, taking 9 mm of distal resection. The distal cut was made. Osteophytes were then removed.  Extension gap was then checked. Next, the proximal tibial cutting guide was placed with appropriate slope, varus/valgus alignment and depth of resection. The proximal tibial cut was made. Gap blocks were then used to assess the extension gap and alignment, and appropriate soft tissue releases were performed. Attention was turned back to the femur, which was sized using the sizing guide to a size 6. Appropriate rotation of the femoral component was determined using epicondylar axis, Whiteside's line, and assessing the flexion gap under ligament tension. The appropriate size 4-in-1 cutting block was placed and cuts were made.  The box was then cut for the PS component.  Posterior femoral osteophytes and uncapped bone were then removed with the curved osteotome.  Trial components were placed, and stability was checked in full extension, mid-flexion, and deep flexion. Proper tibial rotation was determined and marked.  The patella tracked well without a lateral release. Trial components were then removed and tibial preparation performed.  The tibia was sized for a size 5 component.  A posterior capsular injection comprising of 20 cc of 1.3% exparel, 20 cc of 0.25% bupivicaine with epi and 20 cc of normal saline was performed for postoperative pain control. The bony surfaces were irrigated with a pulse lavage and then dried. Bone cement was vacuum mixed on the back  table, and the final components sized above were cemented into place. After cement had finished curing, excess cement was removed.  The stability of the construct was re-evaluated throughout a range of motion and found to be acceptable. The trial liner was removed, the knee was copiously irrigated, and the knee was re-evaluated for any excess bone debris. The real polyethylene liner, 13 mm thick, was inserted and checked to ensure the locking mechanism had engaged appropriately. The tourniquet was deflated and hemostasis was achieved. The wound was irrigated with normal saline.  One gram of vancomycin powder was placed in the surgical bed.  Capsular closure was performed with a #1 vicryl, subcutaneous fat closed with a 0 vicryl suture, then subcutaneous tissue closed with interrupted 2.0 vicryl suture. The skin was then closed with a 2.0 nylon and dermabond. A sterile dressing was applied.  The patient was awakened in the operating room and taken to recovery in stable condition. All sponge, needle, and instrument counts were correct at the end of the case.  Position: supine  Complications: none.  Time Out: performed   Drains/Packing: none  Estimated blood loss: minimal  Returned to Recovery Room: in good condition.   Antibiotics: yes   Mechanical VTE (DVT) Prophylaxis: sequential compression devices, TED thigh-high  Chemical VTE (DVT) Prophylaxis: aspirin  Fluid Replacement  Crystalloid: see anesthesia record Blood: none  FFP: none   Specimens Removed: 1 to pathology   Sponge and Instrument Count Correct? yes   PACU: portable radiograph - knee AP and Lateral   Plan/RTC: Return in 2 weeks for wound check.   Weight Bearing/Load Lower Extremity: full   N. Eduard Roux, MD [facility] 1:59 PM

## 2019-07-19 NOTE — Anesthesia Procedure Notes (Signed)
Spinal  Patient location during procedure: OR Staffing Performed: anesthesiologist  Anesthesiologist: Nadya Hopwood E, MD Preanesthetic Checklist Completed: patient identified, IV checked, risks and benefits discussed, surgical consent, monitors and equipment checked, pre-op evaluation and timeout performed Spinal Block Patient position: sitting Prep: DuraPrep and site prepped and draped Patient monitoring: continuous pulse ox, blood pressure and heart rate Approach: midline Location: L3-4 Injection technique: single-shot Needle Needle type: Pencan  Needle gauge: 24 G Needle length: 9 cm Additional Notes Functioning IV was confirmed and monitors were applied. Sterile prep and drape, including hand hygiene and sterile gloves were used. The patient was positioned and the spine was prepped. The skin was anesthetized with lidocaine.  Free flow of clear CSF was obtained prior to injecting local anesthetic into the CSF. The needle was carefully withdrawn. The patient tolerated the procedure well.      

## 2019-07-20 ENCOUNTER — Encounter: Payer: Self-pay | Admitting: *Deleted

## 2019-07-20 DIAGNOSIS — Z6832 Body mass index (BMI) 32.0-32.9, adult: Secondary | ICD-10-CM | POA: Diagnosis not present

## 2019-07-20 DIAGNOSIS — M25762 Osteophyte, left knee: Secondary | ICD-10-CM | POA: Diagnosis not present

## 2019-07-20 DIAGNOSIS — E669 Obesity, unspecified: Secondary | ICD-10-CM | POA: Diagnosis not present

## 2019-07-20 DIAGNOSIS — Z791 Long term (current) use of non-steroidal anti-inflammatories (NSAID): Secondary | ICD-10-CM | POA: Diagnosis not present

## 2019-07-20 DIAGNOSIS — Z79899 Other long term (current) drug therapy: Secondary | ICD-10-CM | POA: Diagnosis not present

## 2019-07-20 DIAGNOSIS — I10 Essential (primary) hypertension: Secondary | ICD-10-CM | POA: Diagnosis not present

## 2019-07-20 DIAGNOSIS — Z7982 Long term (current) use of aspirin: Secondary | ICD-10-CM | POA: Diagnosis not present

## 2019-07-20 DIAGNOSIS — M1712 Unilateral primary osteoarthritis, left knee: Secondary | ICD-10-CM | POA: Diagnosis not present

## 2019-07-20 LAB — CBC
HCT: 36.8 % — ABNORMAL LOW (ref 39.0–52.0)
Hemoglobin: 13.2 g/dL (ref 13.0–17.0)
MCH: 28.6 pg (ref 26.0–34.0)
MCHC: 35.9 g/dL (ref 30.0–36.0)
MCV: 79.7 fL — ABNORMAL LOW (ref 80.0–100.0)
Platelets: 196 10*3/uL (ref 150–400)
RBC: 4.62 MIL/uL (ref 4.22–5.81)
RDW: 13 % (ref 11.5–15.5)
WBC: 5.3 10*3/uL (ref 4.0–10.5)
nRBC: 0 % (ref 0.0–0.2)

## 2019-07-20 LAB — BASIC METABOLIC PANEL
Anion gap: 9 (ref 5–15)
BUN: 16 mg/dL (ref 8–23)
CO2: 25 mmol/L (ref 22–32)
Calcium: 8.4 mg/dL — ABNORMAL LOW (ref 8.9–10.3)
Chloride: 103 mmol/L (ref 98–111)
Creatinine, Ser: 1.63 mg/dL — ABNORMAL HIGH (ref 0.61–1.24)
GFR calc Af Amer: 52 mL/min — ABNORMAL LOW (ref >60)
GFR calc non Af Amer: 45 mL/min — ABNORMAL LOW (ref >60)
Glucose, Bld: 123 mg/dL — ABNORMAL HIGH (ref 70–99)
Potassium: 4.1 mmol/L (ref 3.5–5.1)
Sodium: 137 mmol/L (ref 135–145)

## 2019-07-20 NOTE — Progress Notes (Signed)
Physical Therapy Treatment Patient Details Name: Jordan Barajas MRN: [id number] DOB: [birth year] Today's Date: 07/20/2019    History of Present Illness Admitted for Jordan Barajas;  has a past medical history of Arthritis and Hyperthyroidism (02/01/2016).    PT Comments    Pt demonstrates continued progress with PT goals, performing stairs with bilateral rails safely (simulate going to BR), and with RW (without rails backwards) to simulate outside stairs. Pt demonstrates L knee ROM approximately 4-85 degrees AROM, 2-90 PROM in sitting.   Pt demonstrate good quad activation during gait and exercises today with good understanding of precautions for knee post surgery. He demonstrated improved gait distance, and quality with step through pattern this afternoon. Plan to continue following with acute PT while admitted. Will perform flight of stairs prior to dc tomorrow.   Follow Up Recommendations  Follow surgeon's recommendation for DC plan and follow-up therapies     Equipment Recommendations  None recommended by PT(Already has equipment)    Recommendations for Other Services       Precautions / Restrictions Precautions Precautions: Fall Precaution Comments: Fall risk is slight, and minimized with use of RW    Mobility  Bed Mobility               General bed mobility comments: Pt out of bed in recliner upon approach  Transfers Overall transfer level: Needs assistance Equipment used: Rolling walker (2 wheeled) Transfers: Sit to/from Stand Sit to Stand: Supervision         General transfer comment: Cues for hand placement, safety and technique  Ambulation/Gait Ambulation/Gait assistance: Min guard Gait Distance (Feet): 125 Feet Assistive device: Rolling walker (2 wheeled) Gait Pattern/deviations: Step-through pattern(emerging throughout tx)   Gait velocity interpretation: <1.31 ft/sec, indicative of household ambulator General Gait Details: Supervision for [employer]  today, requiring VCs for proper sequencing and safety   Stairs Stairs: Yes Stairs assistance: Min guard Stair Management: Two rails;Step to pattern;Forwards;Backwards;With walker Number of Stairs: 12 General stair comments: Able to ambulate with good stability and 1 instance of near knee buckling but was able to compensate without therapist intervention.   Wheelchair Mobility    Modified Rankin (Stroke Patients Only)       Balance Overall balance assessment: Needs assistance Sitting-balance support: Feet supported Sitting balance-Leahy Scale: Normal     Standing balance support: Bilateral upper extremity supported Standing balance-Leahy Scale: Fair Standing balance comment: Able to stand without RW but unable to perform weight shifts today        Cognition Arousal/Alertness: Awake/alert Behavior During Therapy: WFL for tasks assessed/performed Overall Cognitive Status: Within Functional Limits for tasks assessed        Exercises Total Joint Exercises Ankle Circles/Pumps: AROM;Both;10 reps Quad Sets: AROM;10 reps;Both;Seated Long Arc Quad: AROM;Both;10 reps;Seated Knee Flexion: AROM;Both;10 reps Goniometric ROM: approximately 4-85 degrees (PROM 3-90) visually inspected    General Comments        Pertinent Vitals/Pain Pain Assessment: 0-10 Pain Score: 4  Pain Location: behind knee, lateral knee, when pushing L knee flexion Pain Descriptors / Indicators: Grimacing Pain Intervention(s): Monitored during session;Repositioned           PT Goals (current goals can now be found in the care plan section) Acute Rehab PT Goals Patient Stated Goal: to be able to chase his grandsons PT Goal Formulation: With patient Time For Goal Achievement: 07/27/19 Potential to Achieve Goals: Good Progress towards PT goals: Progressing toward goals    Frequency    7X/week  PT Plan Current plan remains appropriate       AM-PAC PT "6 Clicks" Mobility   Outcome  Measure  Help needed turning from your back to your side while in a flat bed without using bedrails?: A Little Help needed moving from lying on your back to sitting on the side of a flat bed without using bedrails?: A Little Help needed moving to and from a bed to a chair (including a wheelchair)?: A Little Help needed standing up from a chair using your arms (e.g., wheelchair or bedside chair)?: A Little Help needed to walk in hospital room?: A Little Help needed climbing 3-5 steps with a railing? : A Little 6 Click Score: 18    End of Session Equipment Utilized During Treatment: Gait belt Activity Tolerance: Patient tolerated treatment well Patient left: in chair;with call bell/phone within reach;with family/visitor present Nurse Communication: Mobility status PT Visit Diagnosis: Pain;Other abnormalities of gait and mobility (R26.89) Pain - Right/Left: Left Pain - part of body: Knee     Time: 8101-7510 PT Time Calculation (min) (ACUTE ONLY): 44 min  Charges:  $Gait Training: 8-22 mins $Therapeutic Exercise: 8-22 mins $Therapeutic Activity: 8-22 mins                     Ann Held PT, DPT Acute Rehab [facility] P: [phone]    Renato Gails 07/20/2019, 4:41 PM

## 2019-07-20 NOTE — Progress Notes (Addendum)
Subjective: 1 Day Post-Op Procedure(s) (LRB): LEFT TOTAL KNEE ARTHROPLASTY (Left) Patient reports pain as mild.    Objective: Vital signs in last 24 hours: Temp:  [97.5 F (36.4 C)-98.8 F (37.1 C)] 97.8 F (36.6 C) (02/23 0729) Pulse Rate:  [53-95] 69 (02/23 0729) Resp:  [11-18] 17 (02/23 0729) BP: (109-167)/(71-107) 126/74 (02/23 0729) SpO2:  [82 %-100 %] 99 % (02/23 0729) Weight:  [100.7 kg] 100.7 kg (02/22 0956)  Intake/Output from previous day: 02/22 0701 - 02/23 0700 In: 1600 [I.V.:1500; IV Piggyback:100] Out: 1150 [Urine:1150] Intake/Output this shift: No intake/output data recorded.  Recent Labs    07/20/19 0406  HGB 13.2   Recent Labs    07/20/19 0406  WBC 5.3  RBC 4.62  HCT 36.8*  PLT 196   Recent Labs    07/20/19 0406  NA 137  K 4.1  CL 103  CO2 25  BUN 16  CREATININE 1.63*  GLUCOSE 123*  CALCIUM 8.4*   No results for input(s): LABPT, INR in the last 72 hours.  Neurologically intact Neurovascular intact Sensation intact distally Intact pulses distally Dorsiflexion/Plantar flexion intact Incision: dressing C/D/I No cellulitis present Compartment soft   Assessment/Plan: 1 Day Post-Op Procedure(s) (LRB): LEFT TOTAL KNEE ARTHROPLASTY (Left) Advance diet Up with therapy D/C IV fluids Discharge home with home health possibly after second PT session depending on how well he mobilizes.  He has not yet worked with PT and notes that he has 20 steps to get up to his bedroom If able to d/c today, please call office for d/c order and summary   Anticipated LOS equal to or greater than 2 midnights due to - Age 62 and older with one or more of the following:  - Obesity  - Expected need for hospital services (PT, OT, Nursing) required for safe  discharge  - Anticipated need for postoperative skilled nursing care or inpatient rehab  - Active co-morbidities: None OR   - Unanticipated findings during/Post Surgery: Slow post-op progression: GI,  pain control, mobility  - Patient is a high risk of re-admission due to: None    Cristie Hem 07/20/2019, 8:02 AM

## 2019-07-20 NOTE — Progress Notes (Signed)
Physical Therapy Treatment Patient Details Name: Jordan Barajas MRN: [id number] DOB: 03-23-1956 Today's Date: 07/20/2019    History of Present Illness Admitted for Jordan Barajas;  has a past medical history of Arthritis and Hyperthyroidism (02/01/2016).    PT Comments    Pt is s/p TKA resulting in the deficits listed below (see PT Problem List). Independent prior to admission; Presents to PT with decr functional mobility, pain with L knee ROM and weight bearing; Very motivated to improve; Has about 20 steps to access bedroom and bathroom;  Pt will benefit from skilled PT to increase their independence and safety with mobility to allow discharge to the venue listed below.     Follow Up Recommendations  Follow surgeon's recommendation for DC plan and follow-up therapies     Equipment Recommendations  Other (comment)(Already has equipment)    Recommendations for Other Services       Precautions / Restrictions Precautions Precautions: Fall Precaution Comments: Fall risk is slight, and minimized with use of RW Restrictions Weight Bearing Restrictions: No    Mobility  Bed Mobility Overal bed mobility: Needs Assistance Bed Mobility: Supine to Sit     Supine to sit: Min assist     General bed mobility comments: Initial cues to half bridge to EOB, but it seemed confusing tp pt; He was able to roll and push up to sit on L side with min assist  Transfers Overall transfer level: Needs assistance Equipment used: Rolling walker (2 wheeled) Transfers: Sit to/from Stand Sit to Stand: Min assist         General transfer comment: Min assist to steady; Cues for hand placement, safety and technique  Ambulation/Gait Ambulation/Gait assistance: Min assist;Min guard Gait Distance (Feet): 60 Feet Assistive device: Rolling walker (2 wheeled) Gait Pattern/deviations: Step-through pattern(emerging)     General Gait Details: Initially min assist for RW management, progressing to minguard  assist; cues to stand tall on LLE, and to activate L quad for stance stabiltiy; Slow moving, but steady   Marine scientist Rankin (Stroke Patients Only)       Balance                                            Cognition Arousal/Alertness: Awake/alert Behavior During Therapy: WFL for tasks assessed/performed Overall Cognitive Status: Within Functional Limits for tasks assessed                                        Exercises Total Joint Exercises Ankle Circles/Pumps: AROM;Both;10 reps Quad Sets: AROM;Left;5 reps Heel Slides: AAROM;Left;5 reps Goniometric ROM: approx 4-80 deg    General Comments        Pertinent Vitals/Pain Pain Assessment: 0-10 Pain Score: 8  Pain Location: When pushing L knee flexion range Pain Descriptors / Indicators: Grimacing Pain Intervention(s): Monitored during session;Patient requesting pain meds-RN notified    Home Living Family/patient expects to be discharged to:: Private residence Living Arrangements: Spouse/significant other Available Help at Discharge: Family;Available 24 hours/day Type of Home: House Home Access: Stairs to enter Entrance Stairs-Rails: Right;Left(wide apart) Home Layout: Two level;Bed/bath upstairs Home Equipment: Walker - 2 wheels;Bedside commode      Prior Function Level of Independence: Independent  PT Goals (current goals can now be found in the care plan section) Acute Rehab PT Goals Patient Stated Goal: to be able to chase his grandsons PT Goal Formulation: With patient Time For Goal Achievement: 07/27/19 Potential to Achieve Goals: Good    Frequency    7X/week      PT Plan      Co-evaluation              AM-PAC PT "6 Clicks" Mobility   Outcome Measure  Help needed turning from your back to your side while in a flat bed without using bedrails?: A Little Help needed moving from lying on your back to  sitting on the side of a flat bed without using bedrails?: A Little Help needed moving to and from a bed to a chair (including a wheelchair)?: A Little Help needed standing up from a chair using your arms (e.g., wheelchair or bedside chair)?: A Little Help needed to walk in hospital room?: A Little Help needed climbing 3-5 steps with a railing? : A Little 6 Click Score: 18    End of Session Equipment Utilized During Treatment: Gait belt Activity Tolerance: Patient tolerated treatment well Patient left: in chair;with call bell/phone within reach Nurse Communication: Mobility status PT Visit Diagnosis: Pain;Other abnormalities of gait and mobility (R26.89) Pain - Right/Left: Left Pain - part of body: Knee     Time: 0867-6195 PT Time Calculation (min) (ACUTE ONLY): 42 min  Charges:  $Gait Training: 23-37 mins                     Van Clines, [place]  Acute Rehabilitation Services Pager [phone] Office [phone]    Levi Aland 07/20/2019, 1:11 PM

## 2019-07-20 NOTE — Anesthesia Postprocedure Evaluation (Signed)
Anesthesia Post Note  Patient: Jordan Barajas  Procedure(s) Performed: LEFT TOTAL KNEE ARTHROPLASTY (Left Knee)     Patient location during evaluation: PACU Anesthesia Type: Spinal Level of consciousness: oriented and awake and alert Pain management: pain level controlled Vital Signs Assessment: post-procedure vital signs reviewed and stable Respiratory status: spontaneous breathing, respiratory function stable and nonlabored ventilation Cardiovascular status: blood pressure returned to baseline and stable Postop Assessment: no headache, no backache, no apparent nausea or vomiting and spinal receding Anesthetic complications: no    Last Vitals:  Vitals:   07/20/19 0729 07/20/19 1017  BP: 126/74 126/74  Pulse: 69   Resp: 17   Temp: 36.6 C   SpO2: 99%     Last Pain:  Vitals:   07/20/19 1154  TempSrc:   PainSc: 2                  Lucretia Kern

## 2019-07-20 NOTE — Plan of Care (Signed)
  Problem: Education: Goal: Knowledge of General Education information will improve Description: Including pain rating scale, medication(s)/side effects and non-pharmacologic comfort measures Outcome: Adequate for Discharge   Problem: Health Behavior/Discharge Planning: Goal: Ability to manage health-related needs will improve Outcome: Adequate for Discharge   Problem: Clinical Measurements: Goal: Ability to maintain clinical measurements within normal limits will improve Outcome: Adequate for Discharge Goal: Will remain free from infection Outcome: Adequate for Discharge   

## 2019-07-20 NOTE — Progress Notes (Signed)
Orthopedic Tech Progress Note Patient Details:  Jordan Barajas 28-Feb-1956 [id number]  CPM Left Knee CPM Left Knee: On Left Knee Flexion (Degrees): 90 Left Knee Extension (Degrees): 0 Additional Comments: added ice  Post Interventions Patient Tolerated: Well Instructions Provided: Care of device  Nolberto Cheuvront N Cheryn Lundquist 07/20/2019, 6:18 AM

## 2019-07-20 NOTE — TOC Initial Note (Signed)
Transition of Care [facility]) - Initial/Assessment Note    Patient Details  Name: Jordan Barajas MRN: [id number] Date of Birth: 04-25-1956  [facility] CM/SW Contact:    Bess Kinds, RN Phone Number: [phone] 07/20/2019, 3:48 PM  Clinical Narrative:                 Spoke with patient at the bedside. PTA home with spouse. States he has CPM, RW and 3N1 at home. Asking about shower chair - advised that he can pick up at a medical supply store. Confirmed set-up with [facility] for PT. Spouse to transport home at discharge. TOC following for transition needs.   Expected Discharge Plan: Home w Home Health Services Barriers to Discharge: Continued Medical Work up   Patient Goals and CMS Choice Patient states their goals for this hospitalization and ongoing recovery are:: return home with wife CMS Medicare.gov Compare Post Acute Care list provided to:: Patient Choice offered to / list presented to : Patient  Expected Discharge Plan and Services Expected Discharge Plan: Home w Home Health Services In-house Referral: NA Discharge Planning Services: CM Consult Post Acute Care Choice: Home Health, Durable Medical Equipment Living arrangements for the past 2 months: Single Family Home                 DME Arranged: N/A(not needed) DME Agency: NA       HH Arranged: PT [facility] Date HH Agency Contacted: 07/20/19 Time HH Agency Contacted: 1500 Representative spoke with at [facility] Agency: Tiffany  Prior Living Arrangements/Services Living arrangements for the past 2 months: Single Family Home Lives with:: Self, Spouse Patient language and need for interpreter reviewed:: Yes Do you feel safe going back to the place where you live?: Yes      Need for Family Participation in Patient Care: Yes (Comment) Care giver support system in place?: Yes (comment) Current home services: DME Criminal Activity/Legal Involvement Pertinent to Current  Situation/Hospitalization: No - Comment as needed  Activities of Daily Living Home Assistive Devices/Equipment: Eyeglasses ADL Screening (condition at time of admission) Patient's cognitive ability adequate to safely complete daily activities?: Yes Is the patient deaf or have difficulty hearing?: No Does the patient have difficulty seeing, even when wearing glasses/contacts?: No Does the patient have difficulty concentrating, remembering, or making decisions?: No Patient able to express need for assistance with ADLs?: Yes Does the patient have difficulty dressing or bathing?: No Independently performs ADLs?: Yes (appropriate for developmental age) Does the patient have difficulty walking or climbing stairs?: No Weakness of Legs: None Weakness of Arms/Hands: None  Permission Sought/Granted                  Emotional Assessment Appearance:: Appears younger than stated age Attitude/Demeanor/Rapport: Engaged Affect (typically observed): Accepting Orientation: : Oriented to Self, Oriented to  Time, Oriented to Place, Oriented to Situation Alcohol / Substance Use: Not Applicable Psych Involvement: No (comment)  Admission diagnosis:  Status post total left knee replacement [Z96.652] Patient Active Problem List   Diagnosis Date Noted  . 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 Primary osteoarthritis of left knee 07/18/2019  . Bilateral primary osteoarthritis of knee 06/18/2018  . Hyperthyroidism 02/01/2016   PCP:  Georgann Housekeeper, MD Pharmacy:   [facility] [address], [place] - [address] [place] [address] Phone: [phone] Fax: [phone]     Social Determinants of Health (SDOH) Interventions  Readmission Risk Interventions No flowsheet data found.

## 2019-07-20 NOTE — Discharge Instructions (Signed)

## 2019-07-21 DIAGNOSIS — Z79899 Other long term (current) drug therapy: Secondary | ICD-10-CM | POA: Diagnosis not present

## 2019-07-21 DIAGNOSIS — Z791 Long term (current) use of non-steroidal anti-inflammatories (NSAID): Secondary | ICD-10-CM | POA: Diagnosis not present

## 2019-07-21 DIAGNOSIS — M25762 Osteophyte, left knee: Secondary | ICD-10-CM | POA: Diagnosis not present

## 2019-07-21 DIAGNOSIS — E669 Obesity, unspecified: Secondary | ICD-10-CM | POA: Diagnosis not present

## 2019-07-21 DIAGNOSIS — I10 Essential (primary) hypertension: Secondary | ICD-10-CM | POA: Diagnosis not present

## 2019-07-21 DIAGNOSIS — Z7982 Long term (current) use of aspirin: Secondary | ICD-10-CM | POA: Diagnosis not present

## 2019-07-21 DIAGNOSIS — Z96652 Presence of left artificial knee joint: Secondary | ICD-10-CM | POA: Diagnosis not present

## 2019-07-21 DIAGNOSIS — M1712 Unilateral primary osteoarthritis, left knee: Secondary | ICD-10-CM | POA: Diagnosis not present

## 2019-07-21 DIAGNOSIS — Z6832 Body mass index (BMI) 32.0-32.9, adult: Secondary | ICD-10-CM | POA: Diagnosis not present

## 2019-07-21 NOTE — Discharge Summary (Signed)
Patient ID: Jordan Barajas MRN: [id number] DOB/AGE: Feb 04, 1956 64 y.o.  Admit date: 07/19/2019 Discharge date: 07/21/2019  Admission Diagnoses:  Principal Problem:   Primary osteoarthritis of left knee Active Problems:   Status post total left knee replacement   Discharge Diagnoses:  Same  Past Medical History:  Diagnosis Date  . Arthritis   . Hyperthyroidism 02/01/2016    Surgeries: Procedure(s): LEFT TOTAL KNEE ARTHROPLASTY on 07/19/2019   Consultants:   Discharged Condition: Improved  Hospital Course: Jordan Barajas is an 64 y.o. male who was admitted 07/19/2019 for operative treatment ofPrimary osteoarthritis of left knee. Patient has severe unremitting pain that affects sleep, daily activities, and work/hobbies. After pre-op clearance the patient was taken to the operating room on 07/19/2019 and underwent  Procedure(s): LEFT TOTAL KNEE ARTHROPLASTY.    Patient was given perioperative antibiotics:  Anti-infectives (From admission, onward)   Start     Dose/Rate Route Frequency Ordered Stop   07/19/19 1830  ceFAZolin (ANCEF) IVPB 2g/100 mL premix     2 g 200 mL/hr over 30 Minutes Intravenous Every 6 hours 07/19/19 1804 07/20/19 0609   07/19/19 1354  vancomycin (VANCOCIN) powder  Status:  Discontinued       As needed 07/19/19 1354 07/19/19 1444   07/19/19 1015  ceFAZolin (ANCEF) IVPB 2g/100 mL premix  Status:  Discontinued     2 g 200 mL/hr over 30 Minutes Intravenous On call to O.R. 07/19/19 1001 07/19/19 1757   07/19/19 0600  vancomycin (VANCOREADY) IVPB 1500 mg/300 mL     1,500 mg 150 mL/hr over 120 Minutes Intravenous 120 min pre-op 07/16/19 0813 07/19/19 1252       Patient was given sequential compression devices, early ambulation, and chemoprophylaxis to prevent DVT.  Patient benefited maximally from hospital stay and there were no complications.    Recent vital signs:  Patient Vitals for the past 24 hrs:  BP Temp Temp src Pulse Resp SpO2  07/21/19 0809 132/84 --  -- 74 18 98 %  07/21/19 0346 136/81 98 F (36.7 C) Oral 63 18 98 %  07/20/19 2149 127/74 98.8 F (37.1 C) Oral 83 17 97 %  07/20/19 1529 126/71 97.6 F (36.4 C) Oral 76 17 98 %  07/20/19 1500 126/71 97.6 F (36.4 C) Oral 76 17 98 %  07/20/19 1017 126/74 -- -- -- -- --     Recent laboratory studies:  Recent Labs    07/20/19 0406  WBC 5.3  HGB 13.2  HCT 36.8*  PLT 196  NA 137  K 4.1  CL 103  CO2 25  BUN 16  CREATININE 1.63*  GLUCOSE 123*  CALCIUM 8.4*     Discharge Medications:   Allergies as of 07/21/2019      Reactions   Fish Allergy Hives, Swelling      Medication List    STOP taking these medications   diclofenac Sodium 1 % Gel Commonly known as: Voltaren   meloxicam 15 MG tablet Commonly known as: MOBIC     TAKE these medications   amLODipine 2.5 MG tablet Commonly known as: NORVASC Take 2.5 mg by mouth daily.   aspirin EC 81 MG tablet Take 1 tablet (81 mg total) by mouth 2 (two) times daily.   levothyroxine 150 MCG tablet Commonly known as: SYNTHROID Take 1 tablet (150 mcg total) by mouth daily before breakfast. What changed:   how much to take  Another medication with the same name was removed. Continue taking this medication, and follow  the directions you see here.   methocarbamol 750 MG tablet Commonly known as: ROBAXIN Take 1 tablet (750 mg total) by mouth 2 (two) times daily as needed for muscle spasms.   ondansetron 4 MG tablet Commonly known as: ZOFRAN Take 1-2 tablets (4-8 mg total) by mouth every 8 (eight) hours as needed for nausea or vomiting.   oxyCODONE 5 MG immediate release tablet Commonly known as: Oxy IR/ROXICODONE Take 1-3 tablets (5-15 mg total) by mouth every 8 (eight) hours as needed for severe pain.   oxyCODONE 10 mg 12 hr tablet Commonly known as: OXYCONTIN Take 1 tablet (10 mg total) by mouth every 12 (twelve) hours for 3 days.   sulfamethoxazole-trimethoprim 800-160 MG tablet Commonly known as: BACTRIM  DS Take 1 tablet by mouth 2 (two) times daily.            Durable Medical Equipment  (From admission, onward)         Start     Ordered   07/19/19 1805  DME Walker rolling  Once    Question:  Patient needs a walker to treat with the following condition  Answer:  Total knee replacement status   07/19/19 1804   07/19/19 1805  DME 3 n 1  Once     07/19/19 1804   07/19/19 1805  DME Bedside commode  Once    Question:  Patient needs a bedside commode to treat with the following condition  Answer:  Total knee replacement status   07/19/19 1804          Diagnostic Studies: DG Chest 2 View  Result Date: 07/15/2019 CLINICAL DATA:  Pre-op respiratory exam for left knee arthroplasty. EXAM: CHEST - 2 VIEW COMPARISON:  [facility]/07/[facility] FINDINGS: The heart size and mediastinal contours are within normal limits. Aortic atherosclerosis incidentally noted. Both lungs are clear. The visualized skeletal structures are unremarkable. IMPRESSION: No active cardiopulmonary disease. Electronically Signed   By: Marlaine Hind M.D.   On: 07/15/2019 09:41   DG Knee Left Port  Result Date: 07/19/2019 CLINICAL DATA:  Patient status post left knee replacement today. EXAM: PORTABLE LEFT KNEE - 1-2 VIEW COMPARISON:  Plain films left knee 11 03/2019. FINDINGS: A new total arthroplasty is in place. The device is located. No fracture or other acute abnormality. Gas in the soft tissues from surgery noted. IMPRESSION: Status post left knee replacement.  No acute finding. Electronically Signed   By: Inge Rise M.D.   On: 07/19/2019 15:52    Disposition: Discharge disposition: 01-Home or Self Care         Follow-up Information    Leandrew Koyanagi, MD. Schedule an appointment as soon as possible for a visit in 2 week(s).   Specialty: Orthopedic Surgery Contact information: [place] [address] [phone]        [facility] up.    Specialty: [place] Why: the office will call to schedule physical therapy visits Contact information: [place] [address] [phone]            Signed: Aundra Dubin 07/21/2019, 8:25 AM

## 2019-07-21 NOTE — Progress Notes (Signed)
Subjective: 2 Days Post-Op Procedure(s) (LRB): LEFT TOTAL KNEE ARTHROPLASTY (Left) Patient reports pain as mild.    Objective: Vital signs in last 24 hours: Temp:  [97.6 F (36.4 C)-98.8 F (37.1 C)] 98 F (36.7 C) (02/24 0346) Pulse Rate:  [63-83] 74 (02/24 0809) Resp:  [17-18] 18 (02/24 0809) BP: (126-136)/(71-84) 132/84 (02/24 0809) SpO2:  [97 %-98 %] 98 % (02/24 0809)  Intake/Output from previous day: 02/23 0701 - 02/24 0700 In: 720 [P.O.:720] Out: 500 [Urine:500] Intake/Output this shift: No intake/output data recorded.  Recent Labs    07/20/19 0406  HGB 13.2   Recent Labs    07/20/19 0406  WBC 5.3  RBC 4.62  HCT 36.8*  PLT 196   Recent Labs    07/20/19 0406  NA 137  K 4.1  CL 103  CO2 25  BUN 16  CREATININE 1.63*  GLUCOSE 123*  CALCIUM 8.4*   No results for input(s): LABPT, INR in the last 72 hours.  Neurologically intact Neurovascular intact Sensation intact distally Intact pulses distally Dorsiflexion/Plantar flexion intact Incision: dressing C/D/I No cellulitis present Compartment soft   Assessment/Plan: 2 Days Post-Op Procedure(s) (LRB): LEFT TOTAL KNEE ARTHROPLASTY (Left) Advance diet Up with therapy D/C IV fluids Discharge home with home health after first vs second PT session (depending on how well patient mobilizes) WBAT LLE    Anticipated LOS equal to or greater than 2 midnights due to - Age 16 and older with one or more of the following:  - Obesity  - Expected need for hospital services (PT, OT, Nursing) required for safe  discharge  - Anticipated need for postoperative skilled nursing care or inpatient rehab  - Active co-morbidities: None OR   - Unanticipated findings during/Post Surgery: Slow post-op progression: GI, pain control, mobility  - Patient is a high risk of re-admission due to: None    Cristie Hem 07/21/2019, 8:23 AM

## 2019-07-21 NOTE — Progress Notes (Signed)
Physical Therapy Treatment Patient Details Name: Jordan Barajas MRN: [id number] DOB: 1955/05/29 Today's Date: 07/21/2019    History of Present Illness Admitted for Guido Sander;  has a past medical history of Arthritis and Hyperthyroidism (02/01/2016).    PT Comments    Pt demonstrated much improved functional mobility today, being able to ambulate stairwell 2 flights (12 stairs each) with no instances of knee buckling.  Pt was utilizing CPM prior to session start 0-80 degrees since 6:15 am. Pt was able to teach back concepts and focuses needed for home management with post op care at start of session. Pt continues to report weariness with discharging, but reported understanding that he has met all short-term goals and is doing well for his current time post-op. He continues to demonstrate slow gait but demonstrated improved gait mechanics with step through, and improved balance without RW when toileting today.    Follow Up Recommendations  Follow surgeon's recommendation for DC plan and follow-up therapies     Equipment Recommendations  None recommended by PT       Precautions / Restrictions Precautions Precautions: Fall Precaution Comments: Fall risk is slight, and minimized with use of RW Restrictions Weight Bearing Restrictions: No    Mobility  Bed Mobility Overal bed mobility: Needs Assistance Bed Mobility: Supine to Sit     Supine to sit: Min assist     General bed mobility comments: Pt demonstrates mod I mobility with rail usage  Transfers Overall transfer level: Needs assistance Equipment used: Rolling walker (2 wheeled) Transfers: Sit to/from Stand Sit to Stand: Supervision General transfer comment: Minimal cues for hand placement, safety and technique today  Ambulation/Gait Ambulation/Gait assistance: Supervision Gait Distance (Feet): 175 Feet Assistive device: Rolling walker (2 wheeled) Gait Pattern/deviations: Step-through pattern   Gait velocity  interpretation: <1.31 ft/sec, indicative of household ambulator General Gait Details: Supervision for RW management today, requiring VCs for proper sequencing and safety   Stairs Stairs: Yes Stairs assistance: Min guard Stair Management: One rail Right Number of Stairs: 24 General stair comments: No instances of L knee buckling evident this morning.        Balance Overall balance assessment: Needs assistance Sitting-balance support: Feet supported Sitting balance-Leahy Scale: Normal     Standing balance support: Bilateral upper extremity supported Standing balance-Leahy Scale: Fair Standing balance comment: Able to stand without RW but unable to perform weight shifts today          Cognition Arousal/Alertness: Awake/alert Behavior During Therapy: WFL for tasks assessed/performed Overall Cognitive Status: Within Functional Limits for tasks assessed         Exercises Total Joint Exercises Ankle Circles/Pumps: AROM;Both;10 reps Quad Sets: AROM;10 reps;Both;Seated Short Arc Quad: AROM;Both;10 reps;Seated Heel Slides: AAROM;Left;5 reps Goniometric ROM: 3-85(PROM)    General Comments General comments (skin integrity, edema, etc.): Pt wound dressing intact, no evident      Pertinent Vitals/Pain Pain Assessment: 0-10 Pain Score: 4  Pain Location: behind knee, lateral knee, when pushing L knee flexion Pain Descriptors / Indicators: Grimacing Pain Intervention(s): Premedicated before session;Monitored during session;Utilized relaxation techniques           PT Goals (current goals can now be found in the care plan section) Acute Rehab PT Goals Patient Stated Goal: to be able to chase his grandsons PT Goal Formulation: With patient Time For Goal Achievement: 07/27/19 Potential to Achieve Goals: Good Progress towards PT goals: Progressing toward goals    Frequency    7X/week      PT Plan  Current plan remains appropriate    Co-evaluation               AM-PAC PT "6 Clicks" Mobility   Outcome Measure  Help needed turning from your back to your side while in a flat bed without using bedrails?: A Little Help needed moving from lying on your back to sitting on the side of a flat bed without using bedrails?: A Little Help needed moving to and from a bed to a chair (including a wheelchair)?: A Little Help needed standing up from a chair using your arms (e.g., wheelchair or bedside chair)?: A Little Help needed to walk in hospital room?: A Little Help needed climbing 3-5 steps with a railing? : A Little 6 Click Score: 18    End of Session Equipment Utilized During Treatment: Gait belt Activity Tolerance: Patient tolerated treatment well Patient left: in chair;with call bell/phone within reach;with family/visitor present Nurse Communication: Mobility status PT Visit Diagnosis: Pain;Other abnormalities of gait and mobility (R26.89) Pain - Right/Left: Left Pain - part of body: Knee     Time: 5726-2035 PT Time Calculation (min) (ACUTE ONLY): 51 min  Charges:  $Gait Training: 8-22 mins $Therapeutic Exercise: 8-22 mins $Therapeutic Activity: 8-22 mins                     Ann Held PT, DPT Acute Rehab [facility] P: [phone]    Miquel Stacks A Lavern Crimi 07/21/2019, 9:46 AM

## 2019-07-22 DIAGNOSIS — M1712 Unilateral primary osteoarthritis, left knee: Secondary | ICD-10-CM | POA: Diagnosis not present

## 2019-07-22 DIAGNOSIS — E039 Hypothyroidism, unspecified: Secondary | ICD-10-CM | POA: Diagnosis not present

## 2019-07-22 DIAGNOSIS — Z96652 Presence of left artificial knee joint: Secondary | ICD-10-CM | POA: Diagnosis not present

## 2019-07-22 DIAGNOSIS — Z471 Aftercare following joint replacement surgery: Secondary | ICD-10-CM | POA: Diagnosis not present

## 2019-07-23 DIAGNOSIS — M1712 Unilateral primary osteoarthritis, left knee: Secondary | ICD-10-CM | POA: Diagnosis not present

## 2019-07-23 DIAGNOSIS — E039 Hypothyroidism, unspecified: Secondary | ICD-10-CM | POA: Diagnosis not present

## 2019-07-23 DIAGNOSIS — Z471 Aftercare following joint replacement surgery: Secondary | ICD-10-CM | POA: Diagnosis not present

## 2019-07-23 DIAGNOSIS — Z96652 Presence of left artificial knee joint: Secondary | ICD-10-CM | POA: Diagnosis not present

## 2019-07-27 DIAGNOSIS — Z96652 Presence of left artificial knee joint: Secondary | ICD-10-CM | POA: Diagnosis not present

## 2019-07-27 DIAGNOSIS — M1712 Unilateral primary osteoarthritis, left knee: Secondary | ICD-10-CM | POA: Diagnosis not present

## 2019-07-27 DIAGNOSIS — E039 Hypothyroidism, unspecified: Secondary | ICD-10-CM | POA: Diagnosis not present

## 2019-07-27 DIAGNOSIS — Z471 Aftercare following joint replacement surgery: Secondary | ICD-10-CM | POA: Diagnosis not present

## 2019-07-29 DIAGNOSIS — Z96652 Presence of left artificial knee joint: Secondary | ICD-10-CM | POA: Diagnosis not present

## 2019-07-29 DIAGNOSIS — Z471 Aftercare following joint replacement surgery: Secondary | ICD-10-CM | POA: Diagnosis not present

## 2019-07-29 DIAGNOSIS — M1712 Unilateral primary osteoarthritis, left knee: Secondary | ICD-10-CM | POA: Diagnosis not present

## 2019-07-29 DIAGNOSIS — E039 Hypothyroidism, unspecified: Secondary | ICD-10-CM | POA: Diagnosis not present

## 2019-07-30 DIAGNOSIS — M1712 Unilateral primary osteoarthritis, left knee: Secondary | ICD-10-CM | POA: Diagnosis not present

## 2019-07-30 DIAGNOSIS — Z471 Aftercare following joint replacement surgery: Secondary | ICD-10-CM | POA: Diagnosis not present

## 2019-07-30 DIAGNOSIS — E039 Hypothyroidism, unspecified: Secondary | ICD-10-CM | POA: Diagnosis not present

## 2019-07-30 DIAGNOSIS — Z96652 Presence of left artificial knee joint: Secondary | ICD-10-CM | POA: Diagnosis not present

## 2019-08-03 ENCOUNTER — Encounter: Payer: Self-pay | Admitting: Orthopaedic Surgery

## 2019-08-03 ENCOUNTER — Ambulatory Visit (INDEPENDENT_AMBULATORY_CARE_PROVIDER_SITE_OTHER): Payer: BC Managed Care – PPO | Admitting: Orthopaedic Surgery

## 2019-08-03 ENCOUNTER — Other Ambulatory Visit: Payer: Self-pay

## 2019-08-03 DIAGNOSIS — Z96652 Presence of left artificial knee joint: Secondary | ICD-10-CM | POA: Diagnosis not present

## 2019-08-03 DIAGNOSIS — E039 Hypothyroidism, unspecified: Secondary | ICD-10-CM | POA: Diagnosis not present

## 2019-08-03 DIAGNOSIS — Z471 Aftercare following joint replacement surgery: Secondary | ICD-10-CM | POA: Diagnosis not present

## 2019-08-03 DIAGNOSIS — M1712 Unilateral primary osteoarthritis, left knee: Secondary | ICD-10-CM | POA: Diagnosis not present

## 2019-08-03 MED ORDER — OXYCODONE HCL 5 MG PO TABS: 5.0000 mg | ORAL_TABLET | Freq: Three times a day (TID) | ORAL | 0 refills | Status: DC | PRN

## 2019-08-03 NOTE — Progress Notes (Signed)
   Post-Op Visit Note   Patient: Jordan Barajas           Date of Birth: [birth year]           MRN: [id number] Visit Date: 08/03/2019 PCP: Georgann Housekeeper, MD   Assessment & Plan:  Chief Complaint:  Chief Complaint  Patient presents with  . Left Knee - Pain   Visit Diagnoses:  1. Status post total left knee replacement     Plan: Tuan is 2 weeks status post left total knee replacement.  He is doing very well overall.  He does a refill on our oxycodone.  He is getting up to 90 degrees with the CPM.  No calf tenderness.  Surgical incision is healed.  Sutures were removed and Steri-Strips were applied.  We will send him to outpatient PT at this point.  He is having moderate swelling in the entire leg due to not wearing the TED hose which she will start doing it at this point.  We will see him back in another 4 weeks with two-view x-rays of the left knee.  Out of work note provided today for another month at least.  Questions encouraged and answered.  Follow-Up Instructions: Return in about 4 weeks (around 08/31/2019).   Orders:  Orders Placed This Encounter  Procedures  . Ambulatory referral to Physical Therapy   Meds ordered this encounter  Medications  . oxyCODONE (OXY IR/ROXICODONE) 5 MG immediate release tablet    Sig: Take 1-3 tablets (5-15 mg total) by mouth every 8 (eight) hours as needed for severe pain.    Dispense:  30 tablet    Refill:  0    Imaging: No results found.  PMFS History: Patient Active Problem List   Diagnosis Date Noted  . 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 Primary osteoarthritis of left knee 07/18/2019  . Bilateral primary osteoarthritis of knee 06/18/2018  . Hyperthyroidism 02/01/2016   Past Medical History:  Diagnosis Date  . Arthritis   . Hyperthyroidism 02/01/2016    Family History  Problem Relation Age of Onset  . Thyroid disease Neg Hx     Past Surgical History:  Procedure Laterality Date  . APPENDECTOMY     as a  child  . TOTAL KNEE ARTHROPLASTY Left 07/19/2019   Procedure: LEFT TOTAL KNEE ARTHROPLASTY;  Surgeon: Tarry Kos, MD;  Location: [facility] OR;  Service: Orthopedics;  Laterality: Left;   Social History   Occupational History  . Not on file  Tobacco Use  . Smoking status: Never Smoker  . Smokeless tobacco: Never Used  Substance and Sexual Activity  . Alcohol use: No  . Drug use: No  . Sexual activity: Not on file

## 2019-08-04 DIAGNOSIS — E039 Hypothyroidism, unspecified: Secondary | ICD-10-CM | POA: Diagnosis not present

## 2019-08-04 DIAGNOSIS — Z471 Aftercare following joint replacement surgery: Secondary | ICD-10-CM | POA: Diagnosis not present

## 2019-08-04 DIAGNOSIS — Z96652 Presence of left artificial knee joint: Secondary | ICD-10-CM | POA: Diagnosis not present

## 2019-08-04 DIAGNOSIS — M1712 Unilateral primary osteoarthritis, left knee: Secondary | ICD-10-CM | POA: Diagnosis not present

## 2019-08-10 ENCOUNTER — Encounter: Payer: Self-pay | Admitting: Physical Therapy

## 2019-08-10 ENCOUNTER — Other Ambulatory Visit: Payer: Self-pay

## 2019-08-10 ENCOUNTER — Ambulatory Visit (INDEPENDENT_AMBULATORY_CARE_PROVIDER_SITE_OTHER): Payer: BC Managed Care – PPO | Admitting: Physical Therapy

## 2019-08-10 DIAGNOSIS — R262 Difficulty in walking, not elsewhere classified: Secondary | ICD-10-CM

## 2019-08-10 DIAGNOSIS — M25662 Stiffness of left knee, not elsewhere classified: Secondary | ICD-10-CM

## 2019-08-10 DIAGNOSIS — M6281 Muscle weakness (generalized): Secondary | ICD-10-CM | POA: Diagnosis not present

## 2019-08-10 DIAGNOSIS — M25562 Pain in left knee: Secondary | ICD-10-CM

## 2019-08-10 DIAGNOSIS — R600 Localized edema: Secondary | ICD-10-CM

## 2019-08-10 NOTE — Therapy (Addendum)
[facility] [address] [place], [place], [address] Phone: [phone]   Fax:  [phone]  Physical Therapy Evaluation  Patient Details  Name: Jordan Barajas MRN: [id number] Date of Birth: 10-04-1955 Referring Provider (PT): Gershon Mussel, MD   Encounter Date: 08/10/2019  PT End of Session - 08/10/19 1107    Visit Number  1    Number of Visits  17    Date for PT Re-Evaluation  10/05/19    PT Start Time  1100    PT Stop Time  1145    PT Time Calculation (min)  45 min    Activity Tolerance  Patient tolerated treatment well    Behavior During Therapy  [facility] for tasks assessed/performed       Past Medical History:  Diagnosis Date  . Arthritis   . Hyperthyroidism 02/01/2016    Past Surgical History:  Procedure Laterality Date  . APPENDECTOMY     as a child  . TOTAL KNEE ARTHROPLASTY Left 07/19/2019   Procedure: LEFT TOTAL KNEE ARTHROPLASTY;  Surgeon: Tarry Kos, MD;  Location: [facility] OR;  Service: Orthopedics;  Laterality: Left;    There were no vitals filed for this visit.   Subjective Assessment - 08/10/19 1101    Subjective  Pt arriving to therpay reporting L knee pain of 3/10 pain on medial and lateral border of knee. Pt s/p TKA on 07/19/2019.    Pertinent History  OA, L TKA 07/19/2019, hyperlipidemia    Limitations  Standing;Walking    Patient Stated Goals  Get back to work, walk without walker    Currently in Pain?  Yes    Pain Score  3     Pain Location  Knee    Pain Orientation  Left    Pain Descriptors / Indicators  Aching;Sore    Pain Type  Surgical pain    Pain Onset  More than a month ago    Pain Frequency  Intermittent    Aggravating Factors   bending, stairs    Pain Relieving Factors  resting    Effect of Pain on Daily Activities  difficulty sleeping at night getting comfortable, difficulty with stairs         [facility] PT Assessment - 08/10/19 0001      Assessment   Medical Diagnosis  L TKA 07/19/2019    Referring  Provider (PT)  Gershon Mussel, MD    Hand Dominance  Right    Prior Therapy  no      Precautions   Precautions  None      Restrictions   Weight Bearing Restrictions  No      Balance Screen   Has the patient fallen in the past 6 months  No    Is the patient reluctant to leave their home because of a fear of falling?   No      Home Environment   Living Environment  Private residence    Living Arrangements  Spouse/significant other    Type of Home  House    Home Access  Stairs to enter    Entrance Stairs-Number of Steps  4    Entrance Stairs-Rails  Cannot reach both    Additional Comments  RW,      Prior Function   Level of Independence  Independent    Vocation  Full time employment    Vocation Requirements  maintence, lifts and moves applianes, bends a lot    Leisure  being outdoors in yard, watch  sports      Cognition   Overall Cognitive Status  Within Functional Limits for tasks assessed      Observation/Other Assessments-Edema    Edema  --   +1 pitting edema in L lower leg     Posture/Postural Control   Posture/Postural Control  Postural limitations      ROM / Strength   AROM / PROM / Strength  AROM;Strength      AROM   Overall AROM Comments  PROM: 6-90 degrees on L LE    AROM Assessment Site  Knee    Right/Left Knee  Right;Left    Right Knee Extension  0    Right Knee Flexion  122    Left Knee Extension  10    Left Knee Flexion  80      Strength   Strength Assessment Site  Knee    Right/Left Knee  Right;Left    Right Knee Flexion  5/5    Right Knee Extension  5/5    Left Knee Flexion  3/5    Left Knee Extension  3/5      Palpation   Patella mobility  limited superior/ inferior glide due to swelling    Palpation comment  TTP on medial and lateral joint surface      Transfers   Five time sit to stand comments   26 seconds with UE support      Ambulation/Gait   Ambulation/Gait  Yes    Ambulation/Gait Assistance  6: Modified independent (Device/Increase  time)    Ambulation Distance (Feet)  30 Feet    Assistive device  Rolling walker    Gait Pattern  Step-to pattern;Decreased step length - left;Decreased stance time - left;Left flexed knee in stance;Antalgic                Objective measurements completed on examination: See above findings.      [facility] Adult PT Treatment/Exercise - 08/10/19 0001      Modalities   Modalities  Vasopneumatic      Vasopneumatic   Number Minutes Vasopneumatic   10 minutes    Vasopnuematic Location   Knee    Vasopneumatic Pressure  Low    Vasopneumatic Temperature   34             PT Education - 08/10/19 1106    Education Details  PT POC    Person(s) Educated  Patient    Methods  Explanation;Demonstration;Other (comment)    Comprehension  Verbalized understanding          PT Long Term Goals - 08/10/19 1131      PT LONG TERM GOAL #1   Title  Pt will be independent in his HEP and progression.    Baseline  initial program issued on 08/10/2019    Time  8    Period  Weeks    Status  New      PT LONG TERM GOAL #2   Title  Pt will be able to improve his L knee flexion to >/= 120 degrees.    Baseline  AROM: 80 degrees, lacking 10 degrees extension    Time  8    Period  Weeks    Status  New      PT LONG TERM GOAL #3   Title  Pt will be able to amb >/= 1000 feet with no assistive device with step through gait pattern.    Baseline  amb with rolling walker    Time  8    Period  Weeks    Status  New      PT LONG TERM GOAL #4   Title  pt will be albe bend and pick up objects off the floor without difficulty.    Baseline  uanble to curretnly    Time  8    Period  Weeks    Status  New      PT LONG TERM GOAL #5   Title  Pt will be able to report improved sleeping by greater than 50%.    Time  8    Period  Weeks    Status  New             Plan - 08/10/19 1125    Clinical Impression Statement  Pt arriving to therapy s/p left TKA on 07/19/2019. Pt amb into clinic with  rolling walker. Pt's AROM arc: 10-80 degrees. Pt's PROM arc: 6-90 degrees. Pt presenting with pitting edema in lower leg of +1. Pt with decreaed strength in L LE compared to R. Pt amb with rolling walker with step through gait pattern. Pt could benefit from skilled PT to proress pt's LTG's with the below interventions.    Personal Factors and Comorbidities  Comorbidity 1    Comorbidities  OA, hyperthyroidism, L TKA on 07/19/2019    Examination-Activity Limitations  Sleep;Transfers;Stand;Stairs;Squat    Stability/Clinical Decision Making  Stable/Uncomplicated    Clinical Decision Making  Low    Rehab Potential  Excellent    PT Frequency  2x / week    PT Duration  8 weeks    PT Treatment/Interventions  ADLs/Self Care Home Management;Cryotherapy;Electrical Stimulation;Gait training;Stair training;Functional mobility training;Neuromuscular re-education;Balance training;Therapeutic exercise;Therapeutic activities;Patient/family education;Manual techniques;Passive range of motion;Taping;Vasopneumatic Device    PT Next Visit Plan  TKA protocol, L knee strengtheing, ROM, bike vs Nustep, balance, gait training with st cane    PT Home Exercise Plan  [email or web address]    Consulted and Agree with Plan of Care  Patient       Patient will benefit from skilled therapeutic intervention in order to improve the following deficits and impairments:  Pain, Postural dysfunction, Decreased strength, Decreased range of motion, Decreased activity tolerance, Abnormal gait, Difficulty walking, Increased edema  Visit Diagnosis: Acute pain of left knee - Plan: PT plan of care cert/re-cert  Muscle weakness (generalized) - Plan: PT plan of care cert/re-cert  Stiffness of left knee, not elsewhere classified - Plan: PT plan of care cert/re-cert  Difficulty in walking, not elsewhere classified - Plan: PT plan of care cert/re-cert     Problem List Patient Active Problem List   Diagnosis Date Noted  . Status post total left knee  replacement 07/19/2019  . Primary osteoarthritis of left knee 07/18/2019  . Bilateral primary osteoarthritis of knee 06/18/2018  . Hyperthyroidism 02/01/2016    Sharmon Leyden, PT 08/10/2019, 1:14 PM  [facility] Physical Therapy [address] [place], [place], [address] Phone: [phone]   Fax:  [phone]  Name: Jordan Barajas MRN: [id number] Date of Birth: Oct 17, 1955

## 2019-08-10 NOTE — Patient Instructions (Signed)
Access Code: [email or web address] URL: [email or web address] Date: 08/10/2019 Prepared by: Narda Amber  Exercises Seated Long Arc Quad - 2-3 x daily - 7 x weekly - 2 sets - 10 reps - 5 seconds hold Sit to Stand - 2-3 x daily - 7 x weekly - 1 sets - 10 reps Supine Active Straight Leg Raise - 2-3 x daily - 7 x weekly - 2 sets - 10 reps - 3 seconds hold Supine Bridge - 2-3 x daily - 7 x weekly - 2 sets - 10 reps - 5 seconds hold Long Sitting Quad Set with Towel Roll Under Heel - 2-3 x daily - 7 x weekly - 2 sets - 10 reps - 5 seconds hold Supine Short Arc Quad - 2-3 x daily - 7 x weekly - 2 sets - 10 reps - 5 seconds hold Supine Heel Slide - 2-3 x daily - 7 x weekly - 2 sets - 10 reps

## 2019-08-12 ENCOUNTER — Encounter: Payer: Self-pay | Admitting: Rehabilitative and Restorative Service Providers"

## 2019-08-12 ENCOUNTER — Ambulatory Visit (INDEPENDENT_AMBULATORY_CARE_PROVIDER_SITE_OTHER): Payer: BC Managed Care – PPO | Admitting: Rehabilitative and Restorative Service Providers"

## 2019-08-12 ENCOUNTER — Other Ambulatory Visit: Payer: Self-pay

## 2019-08-12 DIAGNOSIS — R262 Difficulty in walking, not elsewhere classified: Secondary | ICD-10-CM

## 2019-08-12 DIAGNOSIS — R600 Localized edema: Secondary | ICD-10-CM

## 2019-08-12 DIAGNOSIS — M25662 Stiffness of left knee, not elsewhere classified: Secondary | ICD-10-CM | POA: Diagnosis not present

## 2019-08-12 DIAGNOSIS — M25562 Pain in left knee: Secondary | ICD-10-CM

## 2019-08-12 DIAGNOSIS — M6281 Muscle weakness (generalized): Secondary | ICD-10-CM

## 2019-08-12 NOTE — Therapy (Signed)
[facility] [place], [place], [address] Phone: [phone]   Fax:  [phone]  Physical Therapy Treatment  Patient Details  Name: Jordan Barajas MRN: [id number] Date of Birth: [birth year] Referring Provider (PT): Frankey Shown, MD   Encounter Date: 08/12/2019  PT End of Session - 08/12/19 1607    Visit Number  2    Number of Visits  17    Date for PT Re-Evaluation  10/05/19    PT Start Time  2409    PT Stop Time  1625    PT Time Calculation (min)  40 min    Activity Tolerance  Patient tolerated treatment well    Behavior During Therapy  [facility] for tasks assessed/performed       Past Medical History:  Diagnosis Date  . Arthritis   . Hyperthyroidism 02/01/2016    Past Surgical History:  Procedure Laterality Date  . APPENDECTOMY     as a child  . TOTAL KNEE ARTHROPLASTY Left 07/19/2019   Procedure: LEFT TOTAL KNEE ARTHROPLASTY;  Surgeon: Leandrew Koyanagi, MD;  Location: [place];  Service: Orthopedics;  Laterality: Left;    There were no vitals filed for this visit.  Subjective Assessment - 08/12/19 1547    Subjective  Pt. stated 3/10 pain upon arrival on inside and outside.    Pertinent History  OA, L TKA 07/19/2019, hyperlipidemia    Limitations  Standing;Walking    Patient Stated Goals  Get back to work, walk without walker    Currently in Pain?  Yes    Pain Score  3     Pain Location  Knee    Pain Orientation  Left    Pain Descriptors / Indicators  Aching;Sore    Pain Onset  More than a month ago                       [facility] Adult PT Treatment/Exercise - 08/12/19 0001      Exercises   Exercises  Knee/Hip      Knee/Hip Exercises: Stretches   Gastroc Stretch  Both;3 reps;30 seconds   incline board     Knee/Hip Exercises: Aerobic   Nustep  Lvl 5 6 mins      Knee/Hip Exercises: Seated   Sit to Sand  5 reps      Knee/Hip Exercises: Supine   Heel Slides  AROM;Strengthening;Left;15 reps    Straight Leg  Raises  AROM;Strengthening;Left;15 reps   c heel slides   Other Supine Knee/Hip Exercises  supine 90 deg hip flexion LAQ x 10 c 5 second flexion hold      Vasopneumatic   Number Minutes Vasopneumatic   6 minutes    Vasopnuematic Location   Knee    Vasopneumatic Pressure  Medium    Vasopneumatic Temperature   34      Manual Therapy   Manual therapy comments  g2-g3 ap jt mobs, patellar mobs, prom, MET for flexion             PT Education - 08/12/19 1548    Education Details  Review of HEP, cues for intervention. Terence Lux) Educated  Patient    Methods  Explanation;Demonstration    Comprehension  Verbalized understanding          PT Long Term Goals - 08/10/19 1131      PT LONG TERM GOAL #1   Title  Pt will be independent in his HEP and progression.  Baseline  initial program issued on 08/10/2019    Time  8    Period  Weeks    Status  New      PT LONG TERM GOAL #2   Title  Pt will be able to improve his L knee flexion to >/= 120 degrees.    Baseline  AROM: 80 degrees, lacking 10 degrees extension    Time  8    Period  Weeks    Status  New      PT LONG TERM GOAL #3   Title  Pt will be able to amb >/= 1000 feet with no assistive device with step through gait pattern.    Baseline  amb with rolling walker    Time  8    Period  Weeks    Status  New      PT LONG TERM GOAL #4   Title  pt will be albe bend and pick up objects off the floor without difficulty.    Baseline  uanble to curretnly    Time  8    Period  Weeks    Status  New      PT LONG TERM GOAL #5   Title  Pt will be able to report improved sleeping by greater than 50%.    Time  8    Period  Weeks    Status  New            Plan - 08/12/19 1605    Clinical Impression Statement  Pt. continued to ambulation c FWW c step through gait pattern c impairment for extension and flexion mobility c end range symptoms.  Current presentation indicated terminal knee extension active/passive limitation  more prominent and concerning than flexion at this time.  Continued skilled PT services indicated.    Personal Factors and Comorbidities  Comorbidity 1    Comorbidities  OA, hyperthyroidism, L TKA on 07/19/2019    Examination-Activity Limitations  Sleep;Transfers;Stand;Stairs;Squat    Stability/Clinical Decision Making  Stable/Uncomplicated    Rehab Potential  Excellent    PT Frequency  2x / week    PT Duration  8 weeks    PT Treatment/Interventions  ADLs/Self Care Home Management;Cryotherapy;Electrical Stimulation;Gait training;Stair training;Functional mobility training;Neuromuscular re-education;Balance training;Therapeutic exercise;Therapeutic activities;Patient/family education;Manual techniques;Passive range of motion;Taping;Vasopneumatic Device    PT Next Visit Plan  Passive mobility gains, active NWB gravity resistant activity, WB as tolerated to progress functional mobility.    PT Home Exercise Plan  [id number]    Consulted and Agree with Plan of Care  Patient       Patient will benefit from skilled therapeutic intervention in order to improve the following deficits and impairments:  Pain, Postural dysfunction, Decreased strength, Decreased range of motion, Decreased activity tolerance, Abnormal gait, Difficulty walking, Increased edema  Visit Diagnosis: Acute pain of left knee  Muscle weakness (generalized)  Stiffness of left knee, not elsewhere classified  Difficulty in walking, not elsewhere classified  Localized edema     Problem List Patient Active Problem List   Diagnosis Date Noted  . 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 Primary osteoarthritis of left knee 07/18/2019  . Bilateral primary osteoarthritis of knee 06/18/2018  . Hyperthyroidism 02/01/2016    Scot Jun, PT, DPT, OCS, ATC 08/12/19  4:15 PM    [facility] [address] [place], [place], [address] Phone: [phone]   Fax:   [phone]  Name: Jordan Barajas MRN: [id number] Date of Birth: 06/09/1955

## 2019-08-15 NOTE — Addendum Note (Signed)
Addended by: Sharmon Leyden on: 08/15/2019 09:17 PM   Modules accepted: Orders

## 2019-08-16 ENCOUNTER — Other Ambulatory Visit: Payer: Self-pay

## 2019-08-16 ENCOUNTER — Ambulatory Visit: Payer: BC Managed Care – PPO | Admitting: Physical Therapy

## 2019-08-16 ENCOUNTER — Encounter: Payer: Self-pay | Admitting: Physical Therapy

## 2019-08-16 DIAGNOSIS — R600 Localized edema: Secondary | ICD-10-CM

## 2019-08-16 DIAGNOSIS — M6281 Muscle weakness (generalized): Secondary | ICD-10-CM

## 2019-08-16 DIAGNOSIS — M25562 Pain in left knee: Secondary | ICD-10-CM | POA: Diagnosis not present

## 2019-08-16 DIAGNOSIS — R262 Difficulty in walking, not elsewhere classified: Secondary | ICD-10-CM | POA: Diagnosis not present

## 2019-08-16 DIAGNOSIS — M25662 Stiffness of left knee, not elsewhere classified: Secondary | ICD-10-CM

## 2019-08-16 NOTE — Therapy (Signed)
[facility] Physical Therapy [address] [place], [place], [address] Phone: [phone]   Fax:  [phone]  Physical Therapy Treatment  Patient Details  Name: Jordan Barajas MRN: [id number] Date of Birth: 08-29-1955 Referring Provider (PT): Gershon Mussel, MD   Encounter Date: 08/16/2019  PT End of Session - 08/16/19 1019    Visit Number  3    Number of Visits  17    Date for PT Re-Evaluation  10/05/19    PT Start Time  1015    PT Stop Time  1105    PT Time Calculation (min)  50 min    Activity Tolerance  Patient tolerated treatment well    Behavior During Therapy  [facility] for tasks assessed/performed       Past Medical History:  Diagnosis Date  . Arthritis   . Hyperthyroidism 02/01/2016    Past Surgical History:  Procedure Laterality Date  . APPENDECTOMY     as a child  . TOTAL KNEE ARTHROPLASTY Left 07/19/2019   Procedure: LEFT TOTAL KNEE ARTHROPLASTY;  Surgeon: Tarry Kos, MD;  [facility]: [facility] OR;  Service: Orthopedics;  Laterality: Left;    There were no vitals filed for this visit.  Subjective Assessment - 08/16/19 1018    Subjective  Pt arriving today with 3/10 pain on medial left knee.    Pertinent History  OA, L TKA 07/19/2019, hyperlipidemia    Limitations  Standing;Walking    Patient Stated Goals  Get back to work, walk without walker    Currently in Pain?  Yes    Pain Score  3     Pain Location  Knee    Pain Orientation  Left    Pain Descriptors / Indicators  Aching;Sore    Pain Onset  More than a month ago         [facility] PT Assessment - 08/16/19 0001      Assessment   Medical Diagnosis  L TKA 07/19/2019    Referring Provider (PT)  Gershon Mussel, MD    Hand Dominance  Right      Precautions   Precautions  None      Restrictions   Weight Bearing Restrictions  No      AROM   Left Knee Extension  10    Left Knee Flexion  88                   [facility] Adult PT Treatment/Exercise - 08/16/19 0001      Exercises   Exercises  Knee/Hip      Knee/Hip Exercises: Stretches   Gastroc Stretch  Both;3 reps;30 seconds   incline board     Knee/Hip Exercises: Aerobic   Nustep  Lvl 5 6 mins      Knee/Hip Exercises: Seated   Long Arc Quad  Strengthening;Left;15 reps    Sit to [employer]  5 reps      Knee/Hip Exercises: Supine   Short Arc [employer]  Strengthening;Left;15 reps    Short Arc Quad Sets Limitations  holding 5 seocnds    Heel Slides  AROM;Strengthening;Left;15 reps    Bridges  Strengthening;Both;10 reps    Straight Leg Raises  AROM;Strengthening;Left;15 reps   c heel slides   Other Supine Knee/Hip Exercises  ball squeezes x 15 holding 5 seconds each      Knee/Hip Exercises: Sidelying   Hip ABduction  Strengthening;Left;2 sets;10 reps      Modalities   Modalities  Vasopneumatic      Vasopneumatic  Number Minutes Vasopneumatic   10 minutes    Vasopnuematic Location   Knee    Vasopneumatic Pressure  Medium    Vasopneumatic Temperature   34      Manual Therapy   Manual therapy comments  PROM left knee with overpressure, Grade II-III mobs, patella mobs   contract relax x 5 holding 5 seconds each            PT Education - 08/16/19 1019    Education Details  Importance of daily exercise    Person(s) Educated  Patient    Methods  Explanation    Comprehension  Verbalized understanding          PT Long Term Goals - 08/16/19 1020      PT LONG TERM GOAL #1   Title  Pt will be independent in his HEP and progression.    Baseline  initial program issued on 08/10/2019    Time  8    Period  Weeks    Status  New      PT LONG TERM GOAL #2   Title  Pt will be able to improve his L knee flexion to >/= 120 degrees.    Baseline  AROM: 80 degrees, lacking 10 degrees extension    Time  8    Period  Weeks    Status  New      PT LONG TERM GOAL #3   Title  Pt will be able to amb >/= 1000 feet with no assistive device with step through gait pattern.    Baseline  amb with rolling walker     Time  8    Period  Weeks    Status  On-going      PT LONG TERM GOAL #4   Title  pt will be albe bend and pick up objects off the floor without difficulty.    Baseline  uanble to curretnly    Time  8    Period  Weeks    Status  On-going      PT LONG TERM GOAL #5   Title  Pt will be able to report improved sleeping by greater than 50%.    Time  8    Period  Weeks    Status  On-going            Plan - 08/16/19 1030    Clinical Impression Statement  Pt arriving to therapy today reporting 3/10 pain in L medial knee. Pt tolerating treatment well progressing with strengtheing and improved flexion. Pt amb with RW with step through gait pattern. Pt reporting he is walking shorts distances around his home with no device. Pt's AROM arc is 10- 88 degrees. Continue skilled PT progressing toward LTG's.    Personal Factors and Comorbidities  Comorbidity 1    Comorbidities  OA, hyperthyroidism, L TKA on 07/19/2019    Examination-Activity Limitations  Sleep;Transfers;Stand;Stairs;Squat    Stability/Clinical Decision Making  Stable/Uncomplicated    Rehab Potential  Excellent    PT Frequency  2x / week    PT Duration  8 weeks    PT Treatment/Interventions  ADLs/Self Care Home Management;Cryotherapy;Electrical Stimulation;Gait training;Stair training;Functional mobility training;Neuromuscular re-education;Balance training;Therapeutic exercise;Therapeutic activities;Patient/family education;Manual techniques;Passive range of motion;Taping;Vasopneumatic Device    PT Next Visit Plan  Passive mobility gains, active NWB gravity resistant activity, WB as tolerated to progress functional mobility.    PT Home Exercise Plan  [id number]    Consulted and Agree with Plan of Care  Patient  Patient will benefit from skilled therapeutic intervention in order to improve the following deficits and impairments:  Pain, Postural dysfunction, Decreased strength, Decreased range of motion, Decreased activity  tolerance, Abnormal gait, Difficulty walking, Increased edema  Visit Diagnosis: Acute pain of left knee  Muscle weakness (generalized)  Stiffness of left knee, not elsewhere classified  Difficulty in walking, not elsewhere classified  Localized edema     Problem List Patient Active Problem List   Diagnosis Date Noted  . 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 Primary osteoarthritis of left knee 07/18/2019  . Bilateral primary osteoarthritis of knee 06/18/2018  . Hyperthyroidism 02/01/2016    Oretha Caprice, MPT 08/16/2019, 10:48 AM  [facility] Physical Therapy [address] [place], [place], [address] Phone: [phone]   Fax:  [phone]  Name: Jordan Barajas MRN: [id number] Date of Birth: 12-Sep-1955

## 2019-08-19 ENCOUNTER — Other Ambulatory Visit: Payer: Self-pay

## 2019-08-19 ENCOUNTER — Ambulatory Visit: Payer: BC Managed Care – PPO | Admitting: Physical Therapy

## 2019-08-19 DIAGNOSIS — R262 Difficulty in walking, not elsewhere classified: Secondary | ICD-10-CM | POA: Diagnosis not present

## 2019-08-19 DIAGNOSIS — M25562 Pain in left knee: Secondary | ICD-10-CM | POA: Diagnosis not present

## 2019-08-19 DIAGNOSIS — M25662 Stiffness of left knee, not elsewhere classified: Secondary | ICD-10-CM | POA: Diagnosis not present

## 2019-08-19 DIAGNOSIS — M6281 Muscle weakness (generalized): Secondary | ICD-10-CM | POA: Diagnosis not present

## 2019-08-19 DIAGNOSIS — R600 Localized edema: Secondary | ICD-10-CM

## 2019-08-19 NOTE — Therapy (Signed)
[facility] Physical Therapy [address] [place], [place], [address] Phone: [phone]   Fax:  [phone]  Physical Therapy Treatment  Patient Details  Name: Jordan Barajas MRN: [id number] Date of Birth: Feb 22, 1956 Referring Provider (PT): Gershon Mussel, MD   Encounter Date: 08/19/2019  PT End of Session - 08/19/19 1048    Visit Number  4    Number of Visits  17    Date for PT Re-Evaluation  10/05/19    PT Start Time  0935    PT Stop Time  1026    PT Time Calculation (min)  51 min    Activity Tolerance  Patient tolerated treatment well    Behavior During Therapy  [facility] for tasks assessed/performed       Past Medical History:  Diagnosis Date  . Arthritis   . Hyperthyroidism 02/01/2016    Past Surgical History:  Procedure Laterality Date  . APPENDECTOMY     as a child  . TOTAL KNEE ARTHROPLASTY Left 07/19/2019   Procedure: LEFT TOTAL KNEE ARTHROPLASTY;  Surgeon: Tarry Kos, MD;  [facility] OR;  Service: Orthopedics;  Laterality: Left;    There were no vitals filed for this visit.  Subjective Assessment - 08/19/19 1021    Subjective  Pt arriving today with minimal pain in left knee but he did take a pain pill before coming    Pertinent History  OA, L TKA 07/19/2019, hyperlipidemia    Limitations  Standing;Walking    Patient Stated Goals  Get back to work, walk without walker    Pain Onset  More than a month ago                       [facility] Adult PT Treatment/Exercise - 08/19/19 0001      Ambulation/Gait   Gait Comments  gait training with SPC 2 laps around clinic and up/down stairs then progressed to no AD for gait progressed to walking lateral, retro, and fwd with head turns       Knee/Hip Exercises: Stretches   Active Hamstring Stretch  Left;3 reps;30 seconds    Active Hamstring Stretch Limitations  supine with strap    Knee: Self-Stretch Limitations  heelslides supine with strap 10 sec X 10, then seated heelslides with self OP  contract/relax 10 sec X 10    Gastroc Stretch  Both;3 reps;30 seconds      Knee/Hip Exercises: Aerobic   Nustep  L5 X 5 min      Knee/Hip Exercises: Seated   Long Arc Quad  Left;2 sets;15 reps    Long Arc Quad Weight  2 lbs.    Hamstring Curl  Left;2 sets;10 reps    Hamstring Limitations  red    Sit to Sand  10 reps;without UE support   from elevated table     Modalities   Modalities  Vasopneumatic      Vasopneumatic   Number Minutes Vasopneumatic   10 minutes    Vasopnuematic Location   Knee    Vasopneumatic Pressure  Medium    Vasopneumatic Temperature   34      Manual Therapy   Manual therapy comments  PROM left knee with overpressure, Grade II-III mobs, patella mobs                  PT Long Term Goals - 08/16/19 1020      PT LONG TERM GOAL #1   Title  Pt will be independent in his  HEP and progression.    Baseline  initial program issued on 08/10/2019    Time  8    Period  Weeks    Status  New      PT LONG TERM GOAL #2   Title  Pt will be able to improve his L knee flexion to >/= 120 degrees.    Baseline  AROM: 80 degrees, lacking 10 degrees extension    Time  8    Period  Weeks    Status  New      PT LONG TERM GOAL #3   Title  Pt will be able to amb >/= 1000 feet with no assistive device with step through gait pattern.    Baseline  amb with rolling walker    Time  8    Period  Weeks    Status  On-going      PT LONG TERM GOAL #4   Title  pt will be albe bend and pick up objects off the floor without difficulty.    Baseline  uanble to curretnly    Time  8    Period  Weeks    Status  On-going      PT LONG TERM GOAL #5   Title  Pt will be able to report improved sleeping by greater than 50%.    Time  8    Period  Weeks    Status  On-going            Plan - 08/19/19 1050    Clinical Impression Statement  He was able to progress gait from using RW to [facility] to no AD with blance challenges. He shows good stability and balance with gait  without AD so encouraged him to DC RW and use SPC for community ambulation and no AD for household ambulation. Rest of session focused on improving knee ROM and strenght as tolerated. Continue POC    Personal Factors and Comorbidities  Comorbidity 1    Comorbidities  OA, hyperthyroidism, L TKA on 07/19/2019    Examination-Activity Limitations  Sleep;Transfers;Stand;Stairs;Squat    Stability/Clinical Decision Making  Stable/Uncomplicated    Rehab Potential  Excellent    PT Frequency  2x / week    PT Duration  8 weeks    PT Treatment/Interventions  ADLs/Self Care Home Management;Cryotherapy;Electrical Stimulation;Gait training;Stair training;Functional mobility training;Neuromuscular re-education;Balance training;Therapeutic exercise;Therapeutic activities;Patient/family education;Manual techniques;Passive range of motion;Taping;Vasopneumatic Device    PT Next Visit Plan  Passive mobility gains, active NWB gravity resistant activity, WB as tolerated to progress functional mobility.    PT Home Exercise Plan  [id number]    Consulted and Agree with Plan of Care  Patient       Patient will benefit from skilled therapeutic intervention in order to improve the following deficits and impairments:  Pain, Postural dysfunction, Decreased strength, Decreased range of motion, Decreased activity tolerance, Abnormal gait, Difficulty walking, Increased edema  Visit Diagnosis: Acute pain of left knee  Muscle weakness (generalized)  Stiffness of left knee, not elsewhere classified  Difficulty in walking, not elsewhere classified  Localized edema     Problem List Patient Active Problem List   Diagnosis Date Noted  . 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 Primary osteoarthritis of left knee 07/18/2019  . Bilateral primary osteoarthritis of knee 06/18/2018  . Hyperthyroidism 02/01/2016    April Manson, PT,DPT 08/19/2019, 10:52 AM  [facility] Physical Therapy [address] [place], [place], [address] Phone: [phone]   Fax:  [phone]  Name: Colie Fugitt MRN: [id number] Date of Birth: 1956-05-18

## 2019-08-21 ENCOUNTER — Other Ambulatory Visit: Payer: Self-pay | Admitting: Orthopaedic Surgery

## 2019-08-23 NOTE — Telephone Encounter (Signed)
Only needs for 6 weeks post-op

## 2019-08-24 ENCOUNTER — Ambulatory Visit: Payer: BC Managed Care – PPO | Admitting: Physical Therapy

## 2019-08-24 ENCOUNTER — Other Ambulatory Visit: Payer: Self-pay

## 2019-08-24 DIAGNOSIS — M25562 Pain in left knee: Secondary | ICD-10-CM

## 2019-08-24 DIAGNOSIS — R600 Localized edema: Secondary | ICD-10-CM

## 2019-08-24 DIAGNOSIS — M6281 Muscle weakness (generalized): Secondary | ICD-10-CM | POA: Diagnosis not present

## 2019-08-24 DIAGNOSIS — M25662 Stiffness of left knee, not elsewhere classified: Secondary | ICD-10-CM

## 2019-08-24 DIAGNOSIS — R262 Difficulty in walking, not elsewhere classified: Secondary | ICD-10-CM

## 2019-08-24 NOTE — Therapy (Signed)
[facility] Physical Therapy [address] [place], [place], [address] Phone: [phone]   Fax:  [phone]  Physical Therapy Treatment  Patient Details  Name: Jordan Barajas MRN: [id number] Date of Birth: 16-Jul-1955 Referring Provider (PT): Gershon Mussel, MD   Encounter Date: 08/24/2019  PT End of Session - 08/24/19 1032    Visit Number  5    Number of Visits  17    Date for PT Re-Evaluation  10/05/19    PT Start Time  1010    PT Stop Time  1044    PT Time Calculation (min)  34 min    Activity Tolerance  Patient tolerated treatment well    Behavior During Therapy  [facility] for tasks assessed/performed       Past Medical History:  Diagnosis Date  . Arthritis   . Hyperthyroidism 02/01/2016    Past Surgical History:  Procedure Laterality Date  . APPENDECTOMY     as a child  . TOTAL KNEE ARTHROPLASTY Left 07/19/2019   Procedure: LEFT TOTAL KNEE ARTHROPLASTY;  Surgeon: Tarry Kos, MD;  [facility]: [facility] OR;  Service: Orthopedics;  Laterality: Left;    There were no vitals filed for this visit.  Subjective Assessment - 08/24/19 1031    Subjective  Pt arriving today with minimal pain in left knee but a lot of stiffness    Pertinent History  OA, L TKA 07/19/2019, hyperlipidemia    Limitations  Standing;Walking    Patient Stated Goals  Get back to work, walk without walker    Pain Onset  More than a month ago         [facility] PT Assessment - 08/24/19 0001      AROM   Left Knee Flexion  90   AAROM                  [facility] Adult PT Treatment/Exercise - 08/24/19 0001      Knee/Hip Exercises: Stretches   Active Hamstring Stretch  Left;3 reps;30 seconds    Active Hamstring Stretch Limitations  seated    Knee: Self-Stretch Limitations   seated heelslides with self OP contract/relax 10 sec X 10, then standing lunge stretch with foot on 8 inch step 10 sec X 15    Gastroc Stretch  Both;3 reps;30 seconds      Knee/Hip Exercises: Aerobic   Nustep  L5 X 5  min      Knee/Hip Exercises: Seated   Sit to Sand  without UE support;15 reps   slightly elevated table     Manual Therapy   Manual therapy comments  PROM left knee with overpressure, Grade II-III mobs, patella mobs                  PT Long Term Goals - 08/16/19 1020      PT LONG TERM GOAL #1   Title  Pt will be independent in his HEP and progression.    Baseline  initial program issued on 08/10/2019    Time  8    Period  Weeks    Status  New      PT LONG TERM GOAL #2   Title  Pt will be able to improve his L knee flexion to >/= 120 degrees.    Baseline  AROM: 80 degrees, lacking 10 degrees extension    Time  8    Period  Weeks    Status  New      PT LONG TERM GOAL #3  Title  Pt will be able to amb >/= 1000 feet with no assistive device with step through gait pattern.    Baseline  amb with rolling walker    Time  8    Period  Weeks    Status  On-going      PT LONG TERM GOAL #4   Title  pt will be albe bend and pick up objects off the floor without difficulty.    Baseline  uanble to curretnly    Time  8    Period  Weeks    Status  On-going      PT LONG TERM GOAL #5   Title  Pt will be able to report improved sleeping by greater than 50%.    Time  8    Period  Weeks    Status  On-going            Plan - 08/24/19 1033    Clinical Impression Statement  Shorter session at his request due to family emergency. Session focused on knee ROM as tolerated. PT will continue to progress this as able    Personal Factors and Comorbidities  Comorbidity 1    Comorbidities  OA, hyperthyroidism, L TKA on 07/19/2019    Examination-Activity Limitations  Sleep;Transfers;Stand;Stairs;Squat    Stability/Clinical Decision Making  Stable/Uncomplicated    Rehab Potential  Excellent    PT Frequency  2x / week    PT Duration  8 weeks    PT Treatment/Interventions  ADLs/Self Care Home Management;Cryotherapy;Electrical Stimulation;Gait training;Stair training;Functional  mobility training;Neuromuscular re-education;Balance training;Therapeutic exercise;Therapeutic activities;Patient/family education;Manual techniques;Passive range of motion;Taping;Vasopneumatic Device    PT Next Visit Plan  Passive mobility gains, active NWB gravity resistant activity, WB as tolerated to progress functional mobility.    PT Home Exercise Plan  [id number]    Consulted and Agree with Plan of Care  Patient       Patient will benefit from skilled therapeutic intervention in order to improve the following deficits and impairments:  Pain, Postural dysfunction, Decreased strength, Decreased range of motion, Decreased activity tolerance, Abnormal gait, Difficulty walking, Increased edema  Visit Diagnosis: Acute pain of left knee  Muscle weakness (generalized)  Stiffness of left knee, not elsewhere classified  Difficulty in walking, not elsewhere classified  Localized edema     Problem List Patient Active Problem List   Diagnosis Date Noted  . 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 Primary osteoarthritis of left knee 07/18/2019  . Bilateral primary osteoarthritis of knee 06/18/2018  . Hyperthyroidism 02/01/2016    Debbe Odea, PT,DPT 08/24/2019, 10:46 AM  [facility] Physical Therapy [address] [place], [place], [address] Phone: [phone]   Fax:  [phone]  Name: Jordan Barajas MRN: [id number] Date of Birth: Feb 10, 1956

## 2019-08-26 ENCOUNTER — Other Ambulatory Visit: Payer: Self-pay

## 2019-08-26 ENCOUNTER — Encounter: Payer: Self-pay | Admitting: Physical Therapy

## 2019-08-26 ENCOUNTER — Ambulatory Visit (INDEPENDENT_AMBULATORY_CARE_PROVIDER_SITE_OTHER): Payer: BC Managed Care – PPO | Admitting: Physical Therapy

## 2019-08-26 DIAGNOSIS — M6281 Muscle weakness (generalized): Secondary | ICD-10-CM | POA: Diagnosis not present

## 2019-08-26 DIAGNOSIS — M25662 Stiffness of left knee, not elsewhere classified: Secondary | ICD-10-CM

## 2019-08-26 DIAGNOSIS — R262 Difficulty in walking, not elsewhere classified: Secondary | ICD-10-CM | POA: Diagnosis not present

## 2019-08-26 DIAGNOSIS — R600 Localized edema: Secondary | ICD-10-CM

## 2019-08-26 DIAGNOSIS — M25562 Pain in left knee: Secondary | ICD-10-CM | POA: Diagnosis not present

## 2019-08-26 NOTE — Therapy (Signed)
[facility] Physical Therapy [address] [place], [place], [address] Phone: [phone]   Fax:  [phone]  Physical Therapy Treatment  Patient Details  Name: Jordan Barajas MRN: [id number] Date of Birth: Jul 31, 1955 Referring Provider (PT): Gershon Mussel, MD   Encounter Date: 08/26/2019  PT End of Session - 08/26/19 1139    Visit Number  6    Number of Visits  17    Date for PT Re-Evaluation  10/05/19    PT Start Time  1020    PT Stop Time  1120    PT Time Calculation (min)  60 min    Activity Tolerance  Patient tolerated treatment well    Behavior During Therapy  [facility] for tasks assessed/performed       Past Medical History:  Diagnosis Date  . Arthritis   . Hyperthyroidism 02/01/2016    Past Surgical History:  Procedure Laterality Date  . APPENDECTOMY     as a child  . TOTAL KNEE ARTHROPLASTY Left 07/19/2019   Procedure: LEFT TOTAL KNEE ARTHROPLASTY;  Surgeon: Tarry Kos, MD;  [facility]: [facility] OR;  Service: Orthopedics;  Laterality: Left;    There were no vitals filed for this visit.  Subjective Assessment - 08/26/19 1133    Subjective  Pt arriving today with minimal pain in left knee but soreness 3-4/10    Pertinent History  OA, L TKA 07/19/2019, hyperlipidemia    Limitations  Standing;Walking    Patient Stated Goals  Get back to work, walk without walker    Pain Onset  More than a month ago        [facility] Adult PT Treatment/Exercise - 08/26/19 0001      Ambulation/Gait   Gait Comments  gait no AD in clinic 250 ft, still with decreased knee ROM during gait, decreased step length, and decreaesd stance time      Knee/Hip Exercises: Stretches   Active Hamstring Stretch  Left;3 reps;30 seconds    Active Hamstring Stretch Limitations  seated    Knee: Self-Stretch Limitations  lunge stretch with foot in chair 10 sec X 10 reps    Gastroc Stretch  Both;3 reps;30 seconds      Knee/Hip Exercises: Aerobic   Nustep  .21 miles, 387 steps, Lv 6 for 6 min      Knee/Hip Exercises: Machines for Strengthening   Total Gym Leg Press  75 lbs bilat leg press 2X10, then 50 lbs for Lt leg press 2X10      Knee/Hip Exercises: Standing   Other Standing Knee Exercises  knee flexion and hip abd with 3 lbs X 15 bilat      Knee/Hip Exercises: Seated   Long Arc Quad  Left;2 sets;10 reps    Long Arc Quad Weight  5 lbs.    Sit to Sand  without UE support;15 reps   airex pad in chair     Modalities   Modalities  Vasopneumatic      Vasopneumatic   Number Minutes Vasopneumatic   10 minutes   first 3 min with heel prop for extension   Vasopnuematic Location   Knee    Vasopneumatic Pressure  Medium    Vasopneumatic Temperature   34      Manual Therapy   Manual therapy comments  PROM left knee with overpressure, Grade II-III mobs, manual H.S stretching                  PT Long Term Goals - 08/16/19 1020  PT LONG TERM GOAL #1   Title  Pt will be independent in his HEP and progression.    Baseline  initial program issued on 08/10/2019    Time  8    Period  Weeks    Status  New      PT LONG TERM GOAL #2   Title  Pt will be able to improve his L knee flexion to >/= 120 degrees.    Baseline  AROM: 80 degrees, lacking 10 degrees extension    Time  8    Period  Weeks    Status  New      PT LONG TERM GOAL #3   Title  Pt will be able to amb >/= 1000 feet with no assistive device with step through gait pattern.    Baseline  amb with rolling walker    Time  8    Period  Weeks    Status  On-going      PT LONG TERM GOAL #4   Title  pt will be albe bend and pick up objects off the floor without difficulty.    Baseline  uanble to curretnly    Time  8    Period  Weeks    Status  On-going      PT LONG TERM GOAL #5   Title  Pt will be able to report improved sleeping by greater than 50%.    Time  8    Period  Weeks    Status  On-going            Plan - 08/26/19 1140    Clinical Impression Statement  He is slowly improving with  knee ROM but still limited with this. Session focused on ROM and gentle strength progression with good tolerance. He is now ambulating without AD. Used Vaso at end to reduce soreness/inflammaiton and swelling post tx.    Personal Factors and Comorbidities  Comorbidity 1    Comorbidities  OA, hyperthyroidism, L TKA on 07/19/2019    Examination-Activity Limitations  Sleep;Transfers;Stand;Stairs;Squat    Stability/Clinical Decision Making  Stable/Uncomplicated    Rehab Potential  Excellent    PT Frequency  2x / week    PT Duration  8 weeks    PT Treatment/Interventions  ADLs/Self Care Home Management;Cryotherapy;Electrical Stimulation;Gait training;Stair training;Functional mobility training;Neuromuscular re-education;Balance training;Therapeutic exercise;Therapeutic activities;Patient/family education;Manual techniques;Passive range of motion;Taping;Vasopneumatic Device    PT Next Visit Plan  Passive mobility gains, active NWB gravity resistant activity, WB as tolerated to progress functional mobility.    PT Home Exercise Plan  [id number]    Consulted and Agree with Plan of Care  Patient       Patient will benefit from skilled therapeutic intervention in order to improve the following deficits and impairments:  Pain, Postural dysfunction, Decreased strength, Decreased range of motion, Decreased activity tolerance, Abnormal gait, Difficulty walking, Increased edema  Visit Diagnosis: Acute pain of left knee  Muscle weakness (generalized)  Stiffness of left knee, not elsewhere classified  Difficulty in walking, not elsewhere classified  Localized edema     Problem List Patient Active Problem List   Diagnosis Date Noted  . 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 Primary osteoarthritis of left knee 07/18/2019  . Bilateral primary osteoarthritis of knee 06/18/2018  . Hyperthyroidism 02/01/2016    Debbe Odea, PT,DPT 08/26/2019, 11:42 AM  [facility] Physical  Therapy [address] [place], [place], [address] Phone: [phone]   Fax:  [phone]  Name:  Jordan Barajas MRN: [id number] Date of Birth: 08-01-1955

## 2019-08-31 ENCOUNTER — Ambulatory Visit: Payer: BC Managed Care – PPO | Admitting: Physical Therapy

## 2019-08-31 ENCOUNTER — Ambulatory Visit (INDEPENDENT_AMBULATORY_CARE_PROVIDER_SITE_OTHER): Payer: BC Managed Care – PPO | Admitting: Orthopaedic Surgery

## 2019-08-31 ENCOUNTER — Ambulatory Visit: Payer: Self-pay

## 2019-08-31 ENCOUNTER — Other Ambulatory Visit: Payer: Self-pay

## 2019-08-31 DIAGNOSIS — Z96652 Presence of left artificial knee joint: Secondary | ICD-10-CM | POA: Diagnosis not present

## 2019-08-31 DIAGNOSIS — R262 Difficulty in walking, not elsewhere classified: Secondary | ICD-10-CM | POA: Diagnosis not present

## 2019-08-31 DIAGNOSIS — M6281 Muscle weakness (generalized): Secondary | ICD-10-CM | POA: Diagnosis not present

## 2019-08-31 DIAGNOSIS — M25562 Pain in left knee: Secondary | ICD-10-CM

## 2019-08-31 DIAGNOSIS — M25662 Stiffness of left knee, not elsewhere classified: Secondary | ICD-10-CM

## 2019-08-31 DIAGNOSIS — R600 Localized edema: Secondary | ICD-10-CM

## 2019-08-31 MED ORDER — OXYCODONE HCL 5 MG PO TABS: 5.0000 mg | ORAL_TABLET | Freq: Every day | ORAL | 0 refills | Status: DC | PRN

## 2019-08-31 NOTE — Progress Notes (Signed)
   Post-Op Visit Note   Patient: Jordan Barajas           Date of Birth: [birth year]           MRN: [id number] Visit Date: 08/31/2019 PCP: Georgann Housekeeper, MD   Assessment & Plan:  Chief Complaint:  Chief Complaint  Patient presents with  . Left Knee - Pain, Follow-up   Visit Diagnoses:  1. Status post total left knee replacement     Plan: Jordan Barajas is 6 weeks status post left total knee replacement.  Overall he is doing well and has soreness at times.  He takes 5 mg oxycodone with physical therapy.  His range of motion is progressing but he is having some continued issues with using stairs and putting on socks.  He continues to use a CPM.  Surgical scar is fully healed.  Range of motion is 10 degrees to 90 degrees.  No signs of infection.  He has mild swelling around the knee.  His x-rays reveal a stable total knee replacement.  From my standpoint he is progressing well.  He will continue to push with the range of motion.  I refilled his oxycodone.  He will continue with physical therapy.  He can discontinue aspirin and TED hose at this point.  Increase activity as tolerated.  He may drive if he feels comfortable.  Recheck in 6 weeks.  Follow-Up Instructions: Return in about 6 weeks (around 10/12/2019).   Orders:  Orders Placed This Encounter  Procedures  . XR Knee 1-2 Views Left   Meds ordered this encounter  Medications  . oxyCODONE (OXY IR/ROXICODONE) 5 MG immediate release tablet    Sig: Take 1 tablet (5 mg total) by mouth daily as needed for severe pain.    Dispense:  20 tablet    Refill:  0    Imaging: XR Knee 1-2 Views Left  Result Date: 08/31/2019 Stable total knee replacement in good alignment.    PMFS History: Patient Active Problem List   Diagnosis Date Noted  . 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 Primary osteoarthritis of left knee 07/18/2019  . Bilateral primary osteoarthritis of knee 06/18/2018  . Hyperthyroidism 02/01/2016   Past Medical  History:  Diagnosis Date  . Arthritis   . Hyperthyroidism 02/01/2016    Family History  Problem Relation Age of Onset  . Thyroid disease Neg Hx     Past Surgical History:  Procedure Laterality Date  . APPENDECTOMY     as a child  . TOTAL KNEE ARTHROPLASTY Left 07/19/2019   Procedure: LEFT TOTAL KNEE ARTHROPLASTY;  Surgeon: Tarry Kos, MD;  Location: [facility];  Service: Orthopedics;  Laterality: Left;   Social History   Occupational History  . Not on file  Tobacco Use  . Smoking status: Never Smoker  . Smokeless tobacco: Never Used  Substance and Sexual Activity  . Alcohol use: No  . Drug use: No  . Sexual activity: Not on file

## 2019-08-31 NOTE — Therapy (Signed)
[facility] [place], [place], [address] Phone: [phone]   Fax:  [phone]  Physical Therapy Treatment  Patient Details  Name: Jordan Barajas MRN: [id number] Date of Birth: Sep 17, 1955 Referring Provider (PT): Frankey Shown, MD   Encounter Date: 08/31/2019  PT End of Session - 08/31/19 1027    Visit Number  7    Number of Visits  17    Date for PT Re-Evaluation  10/05/19    PT Start Time  0935    PT Stop Time  1015    PT Time Calculation (min)  40 min    Activity Tolerance  Patient tolerated treatment well    Behavior During Therapy  [facility] for tasks assessed/performed       Past Medical History:  Diagnosis Date  . Arthritis   . Hyperthyroidism 02/01/2016    Past Surgical History:  Procedure Laterality Date  . APPENDECTOMY     as a child  . TOTAL KNEE ARTHROPLASTY Left 07/19/2019   Procedure: LEFT TOTAL KNEE ARTHROPLASTY;  Surgeon: Leandrew Koyanagi, MD;  Location: [facility];  Service: Orthopedics;  Laterality: Left;    There were no vitals filed for this visit.  Subjective Assessment - 08/31/19 1010    Subjective  Relays MD was pleased but wants him to have more ROM. Relays pain is manageable today    Pertinent History  OA, L TKA 07/19/2019, hyperlipidemia    Limitations  Standing;Walking    Patient Stated Goals  Get back to work, walk without walker    Pain Onset  More than a month ago       [facility] Adult PT Treatment/Exercise - 08/31/19 0001      Knee/Hip Exercises: Stretches   Active Hamstring Stretch  Left;3 reps;30 seconds    Active Hamstring Stretch Limitations  seated, foot propped on stool    Knee: Self-Stretch Limitations  lunge stretch with foot in chair 20 sec X 5 reps, supine heelslides with strap 10 sec X 10 reps    Gastroc Stretch  Both;3 reps;30 seconds    Gastroc Stretch Limitations  slantboard    Other Knee/Hip Stretches  heel prop for extension 5 lbs X 5 min      Knee/Hip Exercises: Aerobic   Nustep  10 min  L6      Knee/Hip Exercises: Seated   Long Arc Quad  Left;2 sets;10 reps    Long Arc Quad Weight  5 lbs.    Hamstring Curl  Left;2 sets;10 reps    Hamstring Limitations  green    Sit to Sand  without UE support;15 reps      Manual Therapy   Manual therapy comments  PROM left knee with overpressure, Grade II-III mobs, manual H.S stretching                  PT Long Term Goals - 08/16/19 1020      PT LONG TERM GOAL #1   Title  Pt will be independent in his HEP and progression.    Baseline  initial program issued on 08/10/2019    Time  8    Period  Weeks    Status  New      PT LONG TERM GOAL #2   Title  Pt will be able to improve his L knee flexion to >/= 120 degrees.    Baseline  AROM: 80 degrees, lacking 10 degrees extension    Time  8    Period  Weeks  Status  New      PT LONG TERM GOAL #3   Title  Pt will be able to amb >/= 1000 feet with no assistive device with step through gait pattern.    Baseline  amb with rolling walker    Time  8    Period  Weeks    Status  On-going      PT LONG TERM GOAL #4   Title  pt will be albe bend and pick up objects off the floor without difficulty.    Baseline  uanble to curretnly    Time  8    Period  Weeks    Status  On-going      PT LONG TERM GOAL #5   Title  Pt will be able to report improved sleeping by greater than 50%.    Time  8    Period  Weeks    Status  On-going            Plan - 08/31/19 1029    Clinical Impression Statement  Session focused on knee ROM and strength as tolerated. He continues to be limited with this and PT will progress as able.    Personal Factors and Comorbidities  Comorbidity 1    Comorbidities  OA, hyperthyroidism, L TKA on 07/19/2019    Examination-Activity Limitations  Sleep;Transfers;Stand;Stairs;Squat    Stability/Clinical Decision Making  Stable/Uncomplicated    Rehab Potential  Excellent    PT Frequency  2x / week    PT Duration  8 weeks    PT Treatment/Interventions   ADLs/Self Care Home Management;Cryotherapy;Electrical Stimulation;Gait training;Stair training;Functional mobility training;Neuromuscular re-education;Balance training;Therapeutic exercise;Therapeutic activities;Patient/family education;Manual techniques;Passive range of motion;Taping;Vasopneumatic Device    PT Next Visit Plan  Passive mobility gains, active NWB gravity resistant activity, WB as tolerated to progress functional mobility.    PT Home Exercise Plan  [id number]    Consulted and Agree with Plan of Care  Patient       Patient will benefit from skilled therapeutic intervention in order to improve the following deficits and impairments:  Pain, Postural dysfunction, Decreased strength, Decreased range of motion, Decreased activity tolerance, Abnormal gait, Difficulty walking, Increased edema  Visit Diagnosis: Acute pain of left knee  Muscle weakness (generalized)  Stiffness of left knee, not elsewhere classified  Difficulty in walking, not elsewhere classified  Localized edema     Problem List Patient Active Problem List   Diagnosis Date Noted  . 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 Primary osteoarthritis of left knee 07/18/2019  . Bilateral primary osteoarthritis of knee 06/18/2018  . Hyperthyroidism 02/01/2016    April Manson, PT, DPT 08/31/2019, 10:32 AM  [facility] Physical Therapy [address] [place], [place], [address] Phone: [phone]   Fax:  [phone]  Name: Jordan Barajas MRN: [id number] Date of Birth: 1956/04/30

## 2019-09-02 ENCOUNTER — Encounter: Payer: BC Managed Care – PPO | Admitting: Physical Therapy

## 2019-09-07 ENCOUNTER — Encounter: Payer: BC Managed Care – PPO | Admitting: Physical Therapy

## 2019-09-09 ENCOUNTER — Other Ambulatory Visit: Payer: Self-pay

## 2019-09-09 ENCOUNTER — Ambulatory Visit (INDEPENDENT_AMBULATORY_CARE_PROVIDER_SITE_OTHER): Payer: BC Managed Care – PPO | Admitting: Physical Therapy

## 2019-09-09 DIAGNOSIS — M6281 Muscle weakness (generalized): Secondary | ICD-10-CM

## 2019-09-09 DIAGNOSIS — R600 Localized edema: Secondary | ICD-10-CM

## 2019-09-09 DIAGNOSIS — R262 Difficulty in walking, not elsewhere classified: Secondary | ICD-10-CM | POA: Diagnosis not present

## 2019-09-09 DIAGNOSIS — M25662 Stiffness of left knee, not elsewhere classified: Secondary | ICD-10-CM

## 2019-09-09 DIAGNOSIS — M25562 Pain in left knee: Secondary | ICD-10-CM

## 2019-09-09 NOTE — Therapy (Signed)
[facility] Physical Therapy [address] [place], [place], [address] Phone: [phone]   Fax:  [phone]  Physical Therapy Treatment  Patient Details  Name: Jordan Barajas MRN: [id number] Date of Birth: [birth year] Referring Provider (PT): Gershon Mussel, MD   Encounter Date: 09/09/2019  PT End of Session - 09/09/19 1108    Visit Number  8    Number of Visits  17    Date for PT Re-Evaluation  10/05/19    PT Start Time  1015    PT Stop Time  1100    PT Time Calculation (min)  45 min    Activity Tolerance  Patient tolerated treatment well    Behavior During Therapy  [facility] for tasks assessed/performed       Past Medical History:  Diagnosis Date  . Arthritis   . Hyperthyroidism 02/01/2016    Past Surgical History:  Procedure Laterality Date  . APPENDECTOMY     as a child  . TOTAL KNEE ARTHROPLASTY Left 07/19/2019   Procedure: LEFT TOTAL KNEE ARTHROPLASTY;  Surgeon: Tarry Kos, MD;  Location: [facility] OR;  Service: Orthopedics;  Laterality: Left;    There were no vitals filed for this visit.  Subjective Assessment - 09/09/19 1049    Subjective  relays knee is stiff, feels like he cant get the muscles to loosen up    Pertinent History  OA, L TKA 07/19/2019, hyperlipidemia    Limitations  Standing;Walking    Patient Stated Goals  Get back to work, walk without walker    Pain Onset  More than a month ago         [facility] PT Assessment - 09/09/19 0001      ROM / Strength   AROM / PROM / Strength  AROM;Strength      AROM   Left Knee Flexion  90   PROM 96                  [facility] Adult PT Treatment/Exercise - 09/09/19 0001      Knee/Hip Exercises: Stretches   Active Hamstring Stretch  Left;3 reps;30 seconds    Active Hamstring Stretch Limitations  seated    Quad Stretch Limitations  Lt, prone 60 seconds  X3 reps with strap    Gastroc Stretch  Both;3 reps;30 seconds    Gastroc Stretch Limitations  slantboard    Other Knee/Hip Stretches  seated  heelslides with self O.P 10 sec x 10 reps      Knee/Hip Exercises: Aerobic   Recumbent Bike  6 min rocking at beginnig of session, then able to do full circle at end of session      Knee/Hip Exercises: Standing   Other Standing Knee Exercises  sit to stands X 15 reps no UE support      Manual Therapy   Manual therapy comments  PROM left knee flexion and extension,  Grade II-III mobs flexion, extension, patella             PT Education - 09/09/19 1108    Education Details  encouraged more stretching at home 3 times per day and revised HEP    Person(s) Educated  Patient    Methods  Explanation;Demonstration;Verbal cues;Handout    Comprehension  Verbalized understanding          PT Long Term Goals - 08/16/19 1020      PT LONG TERM GOAL #1   Title  Pt will be independent in his HEP and progression.  Baseline  initial program issued on 08/10/2019    Time  8    Period  Weeks    Status  New      PT LONG TERM GOAL #2   Title  Pt will be able to improve his L knee flexion to >/= 120 degrees.    Baseline  AROM: 80 degrees, lacking 10 degrees extension    Time  8    Period  Weeks    Status  New      PT LONG TERM GOAL #3   Title  Pt will be able to amb >/= 1000 feet with no assistive device with step through gait pattern.    Baseline  amb with rolling walker    Time  8    Period  Weeks    Status  On-going      PT LONG TERM GOAL #4   Title  pt will be albe bend and pick up objects off the floor without difficulty.    Baseline  uanble to curretnly    Time  8    Period  Weeks    Status  On-going      PT LONG TERM GOAL #5   Title  Pt will be able to report improved sleeping by greater than 50%.    Time  8    Period  Weeks    Status  On-going            Plan - 09/09/19 1109    Clinical Impression Statement  he is making progress with strength, ambulation, and stairs. He continues to be limited with ROM and his HEP was revised for more ways to stretch and he  was encourged to stretch more at home.    Personal Factors and Comorbidities  Comorbidity 1    Comorbidities  OA, hyperthyroidism, L TKA on 07/19/2019    Examination-Activity Limitations  Sleep;Transfers;Stand;Stairs;Squat    Stability/Clinical Decision Making  Stable/Uncomplicated    Rehab Potential  Excellent    PT Frequency  2x / week    PT Duration  8 weeks    PT Treatment/Interventions  ADLs/Self Care Home Management;Cryotherapy;Electrical Stimulation;Gait training;Stair training;Functional mobility training;Neuromuscular re-education;Balance training;Therapeutic exercise;Therapeutic activities;Patient/family education;Manual techniques;Passive range of motion;Taping;Vasopneumatic Device    PT Next Visit Plan  Passive mobility gains, active NWB gravity resistant activity, WB as tolerated to progress functional mobility.    PT Home Exercise Plan  [id number]    Consulted and Agree with Plan of Care  Patient       Patient will benefit from skilled therapeutic intervention in order to improve the following deficits and impairments:  Pain, Postural dysfunction, Decreased strength, Decreased range of motion, Decreased activity tolerance, Abnormal gait, Difficulty walking, Increased edema  Visit Diagnosis: Acute pain of left knee  Muscle weakness (generalized)  Stiffness of left knee, not elsewhere classified  Difficulty in walking, not elsewhere classified  Localized edema     Problem List Patient Active Problem List   Diagnosis Date Noted  . 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 Primary osteoarthritis of left knee 07/18/2019  . Bilateral primary osteoarthritis of knee 06/18/2018  . Hyperthyroidism 02/01/2016    [name] 09/09/2019, 11:14 AM  [facility] Physical Therapy [address] [place], [place], [address] Phone: [phone]   Fax:  [phone]  Name: Clifford Benninger MRN: [id number] Date of Birth: 09-10-1955

## 2019-09-14 ENCOUNTER — Ambulatory Visit (INDEPENDENT_AMBULATORY_CARE_PROVIDER_SITE_OTHER): Payer: BC Managed Care – PPO | Admitting: Physical Therapy

## 2019-09-14 ENCOUNTER — Other Ambulatory Visit: Payer: Self-pay

## 2019-09-14 DIAGNOSIS — R600 Localized edema: Secondary | ICD-10-CM

## 2019-09-14 DIAGNOSIS — M6281 Muscle weakness (generalized): Secondary | ICD-10-CM

## 2019-09-14 DIAGNOSIS — M25562 Pain in left knee: Secondary | ICD-10-CM

## 2019-09-14 DIAGNOSIS — M25662 Stiffness of left knee, not elsewhere classified: Secondary | ICD-10-CM | POA: Diagnosis not present

## 2019-09-14 NOTE — Therapy (Signed)
[facility] Physical Therapy [address] [place], [place], [address] Phone: [phone]   Fax:  [phone]  Physical Therapy Treatment  Patient Details  Name: Jordan Barajas MRN: [id number] Date of Birth: 1955-08-09 Referring Provider (PT): Gershon Mussel, MD   Encounter Date: 09/14/2019  PT End of Session - 09/14/19 1007    Visit Number  9    Number of Visits  17    Date for PT Re-Evaluation  10/05/19    PT Start Time  0932    PT Stop Time  1015    PT Time Calculation (min)  43 min    Activity Tolerance  Patient tolerated treatment well    Behavior During Therapy  [facility] for tasks assessed/performed       Past Medical History:  Diagnosis Date  . Arthritis   . Hyperthyroidism 02/01/2016    Past Surgical History:  Procedure Laterality Date  . APPENDECTOMY     as a child  . TOTAL KNEE ARTHROPLASTY Left 07/19/2019   Procedure: LEFT TOTAL KNEE ARTHROPLASTY;  Surgeon: Tarry Kos, MD;  Location: [facility];  Service: Orthopedics;  Laterality: Left;    There were no vitals filed for this visit.  Subjective Assessment - 09/14/19 0940    Subjective  relays Lt knee is stiff, feels like it gets unstiff some but then goes right back to being stiff.    Pertinent History  OA, L TKA 07/19/2019, hyperlipidemia    Limitations  Standing;Walking    Patient Stated Goals  Get back to work, walk without walker    Pain Onset  More than a month ago           [facility] Adult PT Treatment/Exercise - 09/14/19 0001      Knee/Hip Exercises: Stretches   Active Hamstring Stretch  Left;3 reps;30 seconds    Active Hamstring Stretch Limitations  seated    Passive Hamstring Stretch Limitations  prone hang 4 min with 3 lb wt around ankle    Quad Stretch Limitations  Lt, prone 60 seconds  X3 reps with strap    Gastroc Stretch  Both;3 reps;30 seconds    Gastroc Stretch Limitations  slantboard    Other Knee/Hip Stretches  seated heelslides with self O.P 10 sec x 10 reps    Other Knee/Hip  Stretches  standing lunge stretch with foot in chair for knee flexion 20 sec X 5      Knee/Hip Exercises: Aerobic   Recumbent Bike  6 min full revolutions      Knee/Hip Exercises: Standing   Forward Step Up  Left;15 reps;Hand Hold: 0;Step Height: 8"    Step Down Limitations  for Lt leg, stepping down with Rt first slow eccentric lower and stretch X 10 reps on 6 inch step with one UE support    SLS  SLS on Lt 15 sec X 4 reps    Other Standing Knee Exercises  TRX squats 3 sec hold X 15      Manual Therapy   Manual therapy comments  PROM left knee flexion and extension,  Grade II-III mobs flexion, extension, patella         PT Long Term Goals - 08/16/19 1020      PT LONG TERM GOAL #1   Title  Pt will be independent in his HEP and progression.    Baseline  initial program issued on 08/10/2019    Time  8    Period  Weeks    Status  New  PT LONG TERM GOAL #2   Title  Pt will be able to improve his L knee flexion to >/= 120 degrees.    Baseline  AROM: 80 degrees, lacking 10 degrees extension    Time  8    Period  Weeks    Status  New      PT LONG TERM GOAL #3   Title  Pt will be able to amb >/= 1000 feet with no assistive device with step through gait pattern.    Baseline  amb with rolling walker    Time  8    Period  Weeks    Status  On-going      PT LONG TERM GOAL #4   Title  pt will be albe bend and pick up objects off the floor without difficulty.    Baseline  uanble to curretnly    Time  8    Period  Weeks    Status  On-going      PT LONG TERM GOAL #5   Title  Pt will be able to report improved sleeping by greater than 50%.    Time  8    Period  Weeks    Status  On-going            Plan - 09/14/19 1007    Clinical Impression Statement  Made some progress with ROM since last visit and now able to perform full revolutions on bike at beginning of session. Progressed his functional strength today with good tolerance and continued to focus on agressive knee  ROM for Lt knee. Continue POC    Personal Factors and Comorbidities  Comorbidity 1    Comorbidities  OA, hyperthyroidism, L TKA on 07/19/2019    Examination-Activity Limitations  Sleep;Transfers;Stand;Stairs;Squat    Stability/Clinical Decision Making  Stable/Uncomplicated    Rehab Potential  Excellent    PT Frequency  2x / week    PT Duration  8 weeks    PT Treatment/Interventions  ADLs/Self Care Home Management;Cryotherapy;Electrical Stimulation;Gait training;Stair training;Functional mobility training;Neuromuscular re-education;Balance training;Therapeutic exercise;Therapeutic activities;Patient/family education;Manual techniques;Passive range of motion;Taping;Vasopneumatic Device    PT Next Visit Plan  progress with strength and ROM as able    PT Home Exercise Plan  [id number]    Consulted and Agree with Plan of Care  Patient       Patient will benefit from skilled therapeutic intervention in order to improve the following deficits and impairments:  Pain, Postural dysfunction, Decreased strength, Decreased range of motion, Decreased activity tolerance, Abnormal gait, Difficulty walking, Increased edema  Visit Diagnosis: Acute pain of left knee  Muscle weakness (generalized)  Stiffness of left knee, not elsewhere classified  Localized edema     Problem List Patient Active Problem List   Diagnosis Date Noted  . 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 Primary osteoarthritis of left knee 07/18/2019  . Bilateral primary osteoarthritis of knee 06/18/2018  . Hyperthyroidism 02/01/2016    [name] 09/14/2019, 10:23 AM  [facility] Physical Therapy [address] [place], [place], [address] Phone: [phone]   Fax:  [phone]  Name: Jordan Barajas MRN: [id number] Date of Birth: [birth year]

## 2019-09-16 ENCOUNTER — Encounter: Payer: Self-pay | Admitting: Physical Therapy

## 2019-09-16 ENCOUNTER — Ambulatory Visit (INDEPENDENT_AMBULATORY_CARE_PROVIDER_SITE_OTHER): Payer: BC Managed Care – PPO | Admitting: Physical Therapy

## 2019-09-16 ENCOUNTER — Other Ambulatory Visit: Payer: Self-pay

## 2019-09-16 DIAGNOSIS — R262 Difficulty in walking, not elsewhere classified: Secondary | ICD-10-CM

## 2019-09-16 DIAGNOSIS — M25562 Pain in left knee: Secondary | ICD-10-CM

## 2019-09-16 DIAGNOSIS — M6281 Muscle weakness (generalized): Secondary | ICD-10-CM | POA: Diagnosis not present

## 2019-09-16 DIAGNOSIS — M25662 Stiffness of left knee, not elsewhere classified: Secondary | ICD-10-CM | POA: Diagnosis not present

## 2019-09-16 DIAGNOSIS — R600 Localized edema: Secondary | ICD-10-CM | POA: Diagnosis not present

## 2019-09-16 NOTE — Therapy (Signed)
[place], [place], [address] Phone: [phone]   Fax:  [phone]  Physical Therapy Treatment  Patient Details  Name: Jordan Barajas MRN: [id number] Date of Birth: 1955-06-06 Referring Provider (PT): Frankey Shown, MD   Encounter Date: 09/16/2019  PT End of Session - 09/16/19 1527    Visit Number  10    Number of Visits  17    Date for PT Re-Evaluation  10/05/19    PT Start Time  1455   pt arrived late   PT Stop Time  1528    PT Time Calculation (min)  33 min    Activity Tolerance  Patient tolerated treatment well    Behavior During Therapy  [facility] for tasks assessed/performed       Past Medical History:  Diagnosis Date  . Arthritis   . Hyperthyroidism 02/01/2016    Past Surgical History:  Procedure Laterality Date  . APPENDECTOMY     as a child  . TOTAL KNEE ARTHROPLASTY Left 07/19/2019   Procedure: LEFT TOTAL KNEE ARTHROPLASTY;  Surgeon: Leandrew Koyanagi, MD;  Location: [place];  Service: Orthopedics;  Laterality: Left;    There were no vitals filed for this visit.  Subjective Assessment - 09/16/19 1452    Subjective  no pain in Lt knee just stiffness right now    Pertinent History  OA, L TKA 07/19/2019, hyperlipidemia    Limitations  Standing;Walking    Patient Stated Goals  Get back to work, walk without walker    Currently in Pain?  No/denies    Pain Onset  More than a month ago                       [facility] Adult PT Treatment/Exercise - 09/16/19 1457      Knee/Hip Exercises: Stretches   Active Hamstring Stretch  Left;3 reps;30 seconds    Active Hamstring Stretch Limitations  seated    Gastroc Stretch  Both;3 reps;30 seconds    Gastroc Stretch Limitations  slantboard    Other Knee/Hip Stretches  seated heelslides with self O.P 20 sec x 5 reps      Knee/Hip Exercises: Aerobic   Recumbent Bike  partial revolutions; seat 7 x 6 min      Knee/Hip Exercises: Standing   Heel Raises  Both;20  reps    SLS  SLS on Lt 15 sec X 5 reps    Other Standing Knee Exercises  TRX squats 20 reps x 3 sec      Knee/Hip Exercises: Seated   Long Arc Quad  Both;3 sets;10 reps;Weights    Long Arc Quad Weight  5 lbs.      Manual Therapy   Manual therapy comments  Lt knee flexion with Rt LAQ 3x10                  PT Long Term Goals - 08/16/19 1020      PT LONG TERM GOAL #1   Title  Pt will be independent in his HEP and progression.    Baseline  initial program issued on 08/10/2019    Time  8    Period  Weeks    Status  New      PT LONG TERM GOAL #2   Title  Pt will be able to improve his L knee flexion to >/= 120 degrees.    Baseline  AROM: 80 degrees, lacking 10 degrees extension    Time  8    Period  Weeks    Status  New      PT LONG TERM GOAL #3   Title  Pt will be able to amb >/= 1000 feet with no assistive device with step through gait pattern.    Baseline  amb with rolling walker    Time  8    Period  Weeks    Status  On-going      PT LONG TERM GOAL #4   Title  pt will be albe bend and pick up objects off the floor without difficulty.    Baseline  uanble to curretnly    Time  8    Period  Weeks    Status  On-going      PT LONG TERM GOAL #5   Title  Pt will be able to report improved sleeping by greater than 50%.    Time  8    Period  Weeks    Status  On-going            Plan - 09/16/19 1528    Clinical Impression Statement  Pt tolerated session well today with flexion ROM limitations at this time.  Will continue to benefit from PT to maximize funtion.    Personal Factors and Comorbidities  Comorbidity 1    Comorbidities  OA, hyperthyroidism, L TKA on 07/19/2019    Examination-Activity Limitations  Sleep;Transfers;Stand;Stairs;Squat    Stability/Clinical Decision Making  Stable/Uncomplicated    Rehab Potential  Excellent    PT Frequency  2x / week    PT Duration  8 weeks    PT Treatment/Interventions  ADLs/Self Care Home  Management;Cryotherapy;Electrical Stimulation;Gait training;Stair training;Functional mobility training;Neuromuscular re-education;Balance training;Therapeutic exercise;Therapeutic activities;Patient/family education;Manual techniques;Passive range of motion;Taping;Vasopneumatic Device    PT Next Visit Plan  progress with strength and ROM as able    PT Home Exercise Plan  [id number]    Consulted and Agree with Plan of Care  Patient       Patient will benefit from skilled therapeutic intervention in order to improve the following deficits and impairments:  Pain, Postural dysfunction, Decreased strength, Decreased range of motion, Decreased activity tolerance, Abnormal gait, Difficulty walking, Increased edema  Visit Diagnosis: Acute pain of left knee  Muscle weakness (generalized)  Stiffness of left knee, not elsewhere classified  Localized edema  Difficulty in walking, not elsewhere classified     Problem List Patient Active Problem List   Diagnosis Date Noted  . 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 Primary osteoarthritis of left knee 07/18/2019  . Bilateral primary osteoarthritis of knee 06/18/2018  . Hyperthyroidism 02/01/2016      Clarita Crane, PT, DPT 09/16/19 3:29 PM     [place] [facility] Physical Therapy [address] [place], [place], [address] Phone: [phone]   Fax:  [phone]  Name: Adrin Julian MRN: [id number] Date of Birth: December 09, 1955

## 2019-09-21 ENCOUNTER — Other Ambulatory Visit: Payer: Self-pay

## 2019-09-21 ENCOUNTER — Ambulatory Visit (INDEPENDENT_AMBULATORY_CARE_PROVIDER_SITE_OTHER): Payer: BC Managed Care – PPO | Admitting: Physical Therapy

## 2019-09-21 DIAGNOSIS — M25562 Pain in left knee: Secondary | ICD-10-CM | POA: Diagnosis not present

## 2019-09-21 DIAGNOSIS — R600 Localized edema: Secondary | ICD-10-CM | POA: Diagnosis not present

## 2019-09-21 DIAGNOSIS — M25662 Stiffness of left knee, not elsewhere classified: Secondary | ICD-10-CM | POA: Diagnosis not present

## 2019-09-21 DIAGNOSIS — R262 Difficulty in walking, not elsewhere classified: Secondary | ICD-10-CM

## 2019-09-21 DIAGNOSIS — M6281 Muscle weakness (generalized): Secondary | ICD-10-CM | POA: Diagnosis not present

## 2019-09-21 NOTE — Therapy (Signed)
[facility] Physical Therapy [address] [place], [place], [address] Phone: [phone]   Fax:  [phone]  Physical Therapy Treatment  Patient Details  Name: Jordan Barajas MRN: [id number] Date of Birth: 05-07-1956 Referring Provider (PT): Gershon Mussel, MD   Encounter Date: 09/21/2019  PT End of Session - 09/21/19 0906    Visit Number  11    Number of Visits  17    Date for PT Re-Evaluation  10/05/19    PT Start Time  0846    PT Stop Time  0929    PT Time Calculation (min)  43 min    Activity Tolerance  Patient tolerated treatment well    Behavior During Therapy  [facility] for tasks assessed/performed       Past Medical History:  Diagnosis Date  . Arthritis   . Hyperthyroidism 02/01/2016    Past Surgical History:  Procedure Laterality Date  . APPENDECTOMY     as a child  . TOTAL KNEE ARTHROPLASTY Left 07/19/2019   Procedure: LEFT TOTAL KNEE ARTHROPLASTY;  Surgeon: Tarry Kos, MD;  Location[facility];  Service: Orthopedics;  Laterality: Left;    There were no vitals filed for this visit.  Subjective Assessment - 09/21/19 0903    Subjective  Pt arriving today reporting soreness of 4/10  in left knee.    Pertinent History  OA, L TKA 07/19/2019, hyperlipidemia    Patient Stated Goals  Get back to work, walk without walker    Currently in Pain?  Yes    Pain Score  4     Pain Location  Knee    Pain Orientation  Left    Pain Descriptors / Indicators  Aching;Sore    Pain Type  Acute pain    Pain Onset  More than a month ago         [facility] PT Assessment - 09/21/19 0001      AROM   Left Knee Flexion  95                   [facility] Adult PT Treatment/Exercise - 09/21/19 0001      Knee/Hip Exercises: Stretches   Active Hamstring Stretch  Left;3 reps;30 seconds    Active Hamstring Stretch Limitations  seated    Gastroc Stretch  Both;3 reps;30 seconds    Gastroc Stretch Limitations  slantboard      Knee/Hip Exercises: Aerobic   Recumbent Bike   full revolutions today x 7 minutes      Knee/Hip Exercises: Standing   Heel Raises  Both;20 reps    Lateral Step Up Limitations  out/out/up/up x 15 reps with intermittent UE supprot    Step Down Limitations  step with one riser x 20 reps    SLS  SLS with vectors x 15 reps    Other Standing Knee Exercises  TRX squats 20 reps x 3 sec      Knee/Hip Exercises: Seated   Heel Slides  Left;15 reps    Heel Slides Limitations  towel under foot       Knee/Hip Exercises: Supine   Short Arc Quad Sets  AROM;Strengthening    Short Arc Quad Sets Limitations  ball under knee    Bridges  AROM;Strengthening;15 reps             PT Education - 09/21/19 8127    Education Details  Encouraged continued stretching at least twice a day and walking. Also discussed ice and elevation to help with edema.  Person(s) Educated  Patient    Methods  Explanation;Demonstration    Comprehension  Verbalized understanding;Returned demonstration          PT Long Term Goals - 08/16/19 1020      PT LONG TERM GOAL #1   Title  Pt will be independent in his HEP and progression.    Baseline  initial program issued on 08/10/2019    Time  8    Period  Weeks    Status  New      PT LONG TERM GOAL #2   Title  Pt will be able to improve his L knee flexion to >/= 120 degrees.    Baseline  AROM: 80 degrees, lacking 10 degrees extension    Time  8    Period  Weeks    Status  New      PT LONG TERM GOAL #3   Title  Pt will be able to amb >/= 1000 feet with no assistive device with step through gait pattern.    Baseline  amb with rolling walker    Time  8    Period  Weeks    Status  On-going      PT LONG TERM GOAL #4   Title  pt will be albe bend and pick up objects off the floor without difficulty.    Baseline  uanble to curretnly    Time  8    Period  Weeks    Status  On-going      PT LONG TERM GOAL #5   Title  Pt will be able to report improved sleeping by greater than 50%.    Time  8    Period   Weeks    Status  On-going            Plan - 09/21/19 0909    Clinical Impression Statement  Pt tolerating exercises well reporting less soreness and stiffness 1/2 way through the session. Pt encrouaged to ice and elevate more at home to hep with infalmmation and edema. Pt also progressing with step downs on step with one riser. Conitnue skilled PT.    Personal Factors and Comorbidities  Comorbidity 1    Comorbidities  OA, hyperthyroidism, L TKA on 07/19/2019    Examination-Activity Limitations  Sleep;Transfers;Stand;Stairs;Squat    Stability/Clinical Decision Making  Stable/Uncomplicated    Rehab Potential  Excellent    PT Frequency  2x / week    PT Duration  8 weeks    PT Treatment/Interventions  ADLs/Self Care Home Management;Cryotherapy;Electrical Stimulation;Gait training;Stair training;Functional mobility training;Neuromuscular re-education;Balance training;Therapeutic exercise;Therapeutic activities;Patient/family education;Manual techniques;Passive range of motion;Taping;Vasopneumatic Device    PT Next Visit Plan  progress with strength and ROM as able    PT Home Exercise Plan  [id number]    Consulted and Agree with Plan of Care  Patient       Patient will benefit from skilled therapeutic intervention in order to improve the following deficits and impairments:  Pain, Postural dysfunction, Decreased strength, Decreased range of motion, Decreased activity tolerance, Abnormal gait, Difficulty walking, Increased edema  Visit Diagnosis: Acute pain of left knee  Muscle weakness (generalized)  Stiffness of left knee, not elsewhere classified  Localized edema  Difficulty in walking, not elsewhere classified     Problem List Patient Active Problem List   Diagnosis Date Noted  . 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 Primary osteoarthritis of left knee 07/18/2019  . Bilateral primary osteoarthritis of knee 06/18/2018  .  Hyperthyroidism 02/01/2016     Jordan Barajas , MPT 09/21/2019, 9:33 AM  [facility] Physical Therapy [address] [place], [place], [address] Phone: [phone]   Fax:  [phone]  Name: Jordan Barajas MRN: [id number] Date of Birth: Oct 19, 1955

## 2019-09-23 ENCOUNTER — Ambulatory Visit (INDEPENDENT_AMBULATORY_CARE_PROVIDER_SITE_OTHER): Payer: BC Managed Care – PPO | Admitting: Physical Therapy

## 2019-09-23 ENCOUNTER — Encounter: Payer: Self-pay | Admitting: Physical Therapy

## 2019-09-23 ENCOUNTER — Other Ambulatory Visit: Payer: Self-pay

## 2019-09-23 DIAGNOSIS — R600 Localized edema: Secondary | ICD-10-CM

## 2019-09-23 DIAGNOSIS — M25562 Pain in left knee: Secondary | ICD-10-CM

## 2019-09-23 DIAGNOSIS — R262 Difficulty in walking, not elsewhere classified: Secondary | ICD-10-CM

## 2019-09-23 DIAGNOSIS — M25662 Stiffness of left knee, not elsewhere classified: Secondary | ICD-10-CM

## 2019-09-23 DIAGNOSIS — M6281 Muscle weakness (generalized): Secondary | ICD-10-CM

## 2019-09-23 NOTE — Therapy (Signed)
[place], [place], [address] Phone: [phone]   Fax:  [phone]  Physical Therapy Treatment  Patient Details  Name: Jordan Barajas MRN: [id number] Date of Birth: [birth year] Referring Provider (PT): Frankey Shown, MD   Encounter Date: 09/23/2019  PT End of Session - 09/23/19 1025    Visit Number  12    Number of Visits  17    Date for PT Re-Evaluation  10/05/19    Authorization - Visit Number  12    Authorization - Number of Visits  30    PT Start Time  0930    PT Stop Time  1975    PT Time Calculation (min)  45 min    Activity Tolerance  Patient tolerated treatment well    Behavior During Therapy  [facility] for tasks assessed/performed       Past Medical History:  Diagnosis Date  . Arthritis   . Hyperthyroidism 02/01/2016    Past Surgical History:  Procedure Laterality Date  . APPENDECTOMY     as a child  . TOTAL KNEE ARTHROPLASTY Left 07/19/2019   Procedure: LEFT TOTAL KNEE ARTHROPLASTY;  Surgeon: Leandrew Koyanagi, MD;  Location: [place];  Service: Orthopedics;  Laterality: Left;    There were no vitals filed for this visit.  Subjective Assessment - 09/23/19 0959    Subjective  Pt arriving today reporting soreness of 3/10  in left knee.    Pertinent History  OA, L TKA 07/19/2019, hyperlipidemia    Limitations  Standing;Walking    Patient Stated Goals  Get back to work, walk without walker    Pain Onset  More than a month ago         [facility] PT Assessment - 09/23/19 0001      Assessment   Medical Diagnosis  L TKA 07/19/2019    Referring Provider (PT)  Frankey Shown, MD    Next MD Visit  5/18      ROM / Strength   AROM / PROM / Strength  AROM;PROM;Strength      AROM   Left Knee Extension  8    Left Knee Flexion  95      PROM   PROM Assessment Site  Knee    Right/Left Knee  Left    Left Knee Flexion  97          [facility] Adult PT Treatment/Exercise - 09/23/19 0001      Knee/Hip Exercises: Stretches    Active Hamstring Stretch  Left;3 reps;30 seconds    Active Hamstring Stretch Limitations  seated    Knee: Self-Stretch Limitations  lunge stretch with foot in chair 10 sec X 10 reps    Gastroc Stretch  Both;3 reps;30 seconds    Gastroc Stretch Limitations  slantboard      Knee/Hip Exercises: Aerobic   Recumbent Bike  full revolutions today x 10 minutes      Knee/Hip Exercises: Machines for Strengthening   Total Gym Leg Press  50 lbs SL press with slow eccentric lower and stretch at bottom 2X15, then bilat push 100 lbs 2X10      Knee/Hip Exercises: Standing   Step Down  2 sets;10 reps;Step Height: 6";Hand Hold: 1    Other Standing Knee Exercises  deep squats at sink 2X10      Manual Therapy   Manual therapy comments  Lt knee flexion with Rt LAQ 3x10, supine PROM flexion and extension, Mobs for flexion and extension, manual hamstring stretching  PT Long Term Goals - 09/23/19 1028      PT LONG TERM GOAL #1   Title  Pt will be independent in his HEP and progression.    Baseline  initial program issued on 08/10/2019    Time  8    Period  Weeks    Status  On-going      PT LONG TERM GOAL #2   Title  Pt will be able to improve his L knee flexion to >/= 120 degrees.    Baseline  AROM: 95 degrees, lacking 10 degrees extension    Time  8    Period  Weeks    Status  On-going      PT LONG TERM GOAL #3   Title  Pt will be able to amb >/= 1000 feet with no assistive device with step through gait pattern.    Baseline  now community ambulator, no AD    Time  8    Period  Weeks    Status  Achieved      PT LONG TERM GOAL #4   Title  pt will be albe bend and pick up objects off the floor without difficulty.    Baseline  now met    Time  8    Period  Weeks    Status  Achieved      PT LONG TERM GOAL #5   Title  Pt will be able to report improved sleeping by greater than 50%.    Baseline  now met    Time  8    Period  Weeks    Status  Achieved            Plan - 09/23/19  1027    Clinical Impression Statement  He is overall progressing with leg strength and Lt knee ROM however continues to have deficits in these areas and will continue to benefit from PT.    Personal Factors and Comorbidities  Comorbidity 1    Comorbidities  OA, hyperthyroidism, L TKA on 07/19/2019    Examination-Activity Limitations  Sleep;Transfers;Stand;Stairs;Squat    Stability/Clinical Decision Making  Stable/Uncomplicated    Rehab Potential  Excellent    PT Frequency  2x / week    PT Duration  8 weeks    PT Treatment/Interventions  ADLs/Self Care Home Management;Cryotherapy;Electrical Stimulation;Gait training;Stair training;Functional mobility training;Neuromuscular re-education;Balance training;Therapeutic exercise;Therapeutic activities;Patient/family education;Manual techniques;Passive range of motion;Taping;Vasopneumatic Device    PT Next Visit Plan  progress with strength and ROM as able    PT Home Exercise Plan  [id number]    Consulted and Agree with Plan of Care  Patient       Patient will benefit from skilled therapeutic intervention in order to improve the following deficits and impairments:  Pain, Postural dysfunction, Decreased strength, Decreased range of motion, Decreased activity tolerance, Abnormal gait, Difficulty walking, Increased edema  Visit Diagnosis: Acute pain of left knee  Muscle weakness (generalized)  Stiffness of left knee, not elsewhere classified  Localized edema  Difficulty in walking, not elsewhere classified     Problem List Patient Active Problem List   Diagnosis Date Noted  . 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 Primary osteoarthritis of left knee 07/18/2019  . Bilateral primary osteoarthritis of knee 06/18/2018  . Hyperthyroidism 02/01/2016    [name] 09/23/2019, 10:31 AM  [facility] Physical Therapy [address] [place], [place], [address] Phone: [phone]   Fax:   [phone]  Name: Mcarthur Ivins MRN: [id number]  Date of Birth: 1955-07-21

## 2019-09-28 ENCOUNTER — Ambulatory Visit (INDEPENDENT_AMBULATORY_CARE_PROVIDER_SITE_OTHER): Payer: BC Managed Care – PPO | Admitting: Physical Therapy

## 2019-09-28 ENCOUNTER — Other Ambulatory Visit: Payer: Self-pay

## 2019-09-28 DIAGNOSIS — M25662 Stiffness of left knee, not elsewhere classified: Secondary | ICD-10-CM | POA: Diagnosis not present

## 2019-09-28 DIAGNOSIS — M6281 Muscle weakness (generalized): Secondary | ICD-10-CM | POA: Diagnosis not present

## 2019-09-28 DIAGNOSIS — R600 Localized edema: Secondary | ICD-10-CM

## 2019-09-28 DIAGNOSIS — M25562 Pain in left knee: Secondary | ICD-10-CM

## 2019-09-28 DIAGNOSIS — R262 Difficulty in walking, not elsewhere classified: Secondary | ICD-10-CM

## 2019-09-28 NOTE — Therapy (Signed)
[place], [place], [address] Phone: [phone]   Fax:  [phone]  Physical Therapy Treatment  Patient Details  Name: Jordan Barajas MRN: [id number] Date of Birth: 05/15/1956 Referring Provider (PT): Frankey Shown, MD   Encounter Date: 09/28/2019  PT End of Session - 09/28/19 0834    Visit Number  13    Number of Visits  17    Date for PT Re-Evaluation  10/05/19    Authorization - Visit Number  13    Authorization - Number of Visits  30    PT Start Time  0800    PT Stop Time  0845    PT Time Calculation (min)  45 min    Activity Tolerance  Patient tolerated treatment well    Behavior During Therapy  [facility] for tasks assessed/performed       Past Medical History:  Diagnosis Date  . Arthritis   . Hyperthyroidism 02/01/2016    Past Surgical History:  Procedure Laterality Date  . APPENDECTOMY     as a child  . TOTAL KNEE ARTHROPLASTY Left 07/19/2019   Procedure: LEFT TOTAL KNEE ARTHROPLASTY;  Surgeon: Leandrew Koyanagi, MD;  Location: [place];  Service: Orthopedics;  Laterality: Left;    There were no vitals filed for this visit.  Subjective Assessment - 09/28/19 0804    Subjective  My knee is coming along 3/10  pain in left knee.    Pertinent History  OA, L TKA 07/19/2019, hyperlipidemia    Limitations  Standing;Walking    Patient Stated Goals  Get back to work, walk without walker    Pain Onset  More than a month ago                       [facility] Adult PT Treatment/Exercise - 09/28/19 0001      Knee/Hip Exercises: Stretches   Active Hamstring Stretch  Left;3 reps;30 seconds    Active Hamstring Stretch Limitations  seated    Quad Stretch  Left;3 reps;30 seconds    Quad Stretch Limitations  supine with strap leg off Eob    Knee: Self-Stretch Limitations  lunge stretch with foot in chair 10 sec X 10 reps    Gastroc Stretch  Both;3 reps;30 seconds    Gastroc Stretch Limitations  slantboard       Knee/Hip Exercises: Aerobic   Nustep  bike occupied so performed nu step warm up L 6 seat 11 X 6 min      Knee/Hip Exercises: Machines for Strengthening   Total Gym Leg Press  56 lbs SL press with slow eccentric lower and stretch at bottom 2X10, then bilat push 106 lbs 2X10      Knee/Hip Exercises: Standing   Forward Step Up  Left;15 reps;Hand Hold: 0;Step Height: 6"    Step Down  Step Height: 6";Hand Hold: 1;1 set;15 reps    SLS  SLS able to hold 30 sec this was then progressed with head turns lateral and up/down X 10 ea    Other Standing Knee Exercises  deep squats holding at treadmil rail 2X10      Knee/Hip Exercises: Seated   Heel Slides  Left    Heel Slides Limitations  10 seconds X 10 reps with self O.P      Manual Therapy   Manual therapy comments  Lt knee flexion with Rt LAQ 3x10, supine PROM flexion and extension, Mobs for flexion and extension, manual hamstring stretching  PT Long Term Goals - 09/23/19 1028      PT LONG TERM GOAL #1   Title  Pt will be independent in his HEP and progression.    Baseline  initial program issued on 08/10/2019    Time  8    Period  Weeks    Status  On-going      PT LONG TERM GOAL #2   Title  Pt will be able to improve his L knee flexion to >/= 120 degrees.    Baseline  AROM: 95 degrees, lacking 10 degrees extension    Time  8    Period  Weeks    Status  On-going      PT LONG TERM GOAL #3   Title  Pt will be able to amb >/= 1000 feet with no assistive device with step through gait pattern.    Baseline  now community ambulator, no AD    Time  8    Period  Weeks    Status  Achieved      PT LONG TERM GOAL #4   Title  pt will be albe bend and pick up objects off the floor without difficulty.    Baseline  now met    Time  8    Period  Weeks    Status  Achieved      PT LONG TERM GOAL #5   Title  Pt will be able to report improved sleeping by greater than 50%.    Baseline  now met    Time  8    Period   Weeks    Status  Achieved            Plan - 09/28/19 0837    Clinical Impression Statement  Session continued to focus on knee ROM as this is his biggest deficit. He is doing well with functional strength and now going up/down stairs without much difficulty. Continue POC.    Personal Factors and Comorbidities  Comorbidity 1    Comorbidities  OA, hyperthyroidism, L TKA on 07/19/2019    Examination-Activity Limitations  Sleep;Transfers;Stand;Stairs;Squat    Stability/Clinical Decision Making  Stable/Uncomplicated    Rehab Potential  Excellent    PT Frequency  2x / week    PT Duration  8 weeks    PT Treatment/Interventions  ADLs/Self Care Home Management;Cryotherapy;Electrical Stimulation;Gait training;Stair training;Functional mobility training;Neuromuscular re-education;Balance training;Therapeutic exercise;Therapeutic activities;Patient/family education;Manual techniques;Passive range of motion;Taping;Vasopneumatic Device    PT Next Visit Plan  progress with strength and ROM as able    PT Home Exercise Plan  [id number]    Consulted and Agree with Plan of Care  Patient       Patient will benefit from skilled therapeutic intervention in order to improve the following deficits and impairments:  Pain, Postural dysfunction, Decreased strength, Decreased range of motion, Decreased activity tolerance, Abnormal gait, Difficulty walking, Increased edema  Visit Diagnosis: Acute pain of left knee  Muscle weakness (generalized)  Stiffness of left knee, not elsewhere classified  Localized edema  Difficulty in walking, not elsewhere classified     Problem List Patient Active Problem List   Diagnosis Date Noted  . 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 Primary osteoarthritis of left knee 07/18/2019  . Bilateral primary osteoarthritis of knee 06/18/2018  . Hyperthyroidism 02/01/2016    [name] 09/28/2019, 8:50 AM  [facility] Physical  Therapy [address] [place], [place], [address] Phone: [phone]   Fax:  [phone]  Name:  Jordan Barajas MRN: [id number] Date of Birth: 08-01-1955

## 2019-10-05 ENCOUNTER — Other Ambulatory Visit: Payer: Self-pay

## 2019-10-05 ENCOUNTER — Ambulatory Visit (INDEPENDENT_AMBULATORY_CARE_PROVIDER_SITE_OTHER): Payer: BC Managed Care – PPO | Admitting: Physical Therapy

## 2019-10-05 ENCOUNTER — Encounter: Payer: Self-pay | Admitting: Physical Therapy

## 2019-10-05 DIAGNOSIS — M25562 Pain in left knee: Secondary | ICD-10-CM | POA: Diagnosis not present

## 2019-10-05 DIAGNOSIS — M6281 Muscle weakness (generalized): Secondary | ICD-10-CM

## 2019-10-05 DIAGNOSIS — M25662 Stiffness of left knee, not elsewhere classified: Secondary | ICD-10-CM

## 2019-10-05 DIAGNOSIS — R600 Localized edema: Secondary | ICD-10-CM | POA: Diagnosis not present

## 2019-10-05 DIAGNOSIS — R262 Difficulty in walking, not elsewhere classified: Secondary | ICD-10-CM

## 2019-10-05 NOTE — Therapy (Signed)
[place], [place], [address] Phone: [phone]   Fax:  [phone]  Physical Therapy Treatment/Recert  Patient Details  Name: Jordan Barajas MRN: [id number] Date of Birth: 04/21/1956 Referring Provider (PT): Frankey Shown, MD   Encounter Date: 10/05/2019  PT End of Session - 10/05/19 0941    Visit Number  14    Number of Visits  24    Date for PT Re-Evaluation  11/09/19    Authorization - Visit Number  14    Authorization - Number of Visits  30    PT Start Time  0845    PT Stop Time  0930    PT Time Calculation (min)  45 min    Activity Tolerance  Patient tolerated treatment well    Behavior During Therapy  [facility] for tasks assessed/performed       Past Medical History:  Diagnosis Date  . Arthritis   . Hyperthyroidism 02/01/2016    Past Surgical History:  Procedure Laterality Date  . APPENDECTOMY     as a child  . TOTAL KNEE ARTHROPLASTY Left 07/19/2019   Procedure: LEFT TOTAL KNEE ARTHROPLASTY;  Surgeon: Leandrew Koyanagi, MD;  Location: [place];  Service: Orthopedics;  Laterality: Left;    There were no vitals filed for this visit.  Subjective Assessment - 10/05/19 0954    Subjective  Jordan knee is doing a little better, still stiff, I am not 100% ready to go back to work. I feel I need more PT    Pertinent History  OA, L TKA 07/19/2019, hyperlipidemia    Limitations  Standing;Walking    Patient Stated Goals  Get back to work, walk without walker    Currently in Pain?  Yes    Pain Score  2     Pain Location  Knee    Pain Orientation  Left    Pain Onset  More than a month ago         [facility] PT Assessment - 10/05/19 0001      Assessment   Medical Diagnosis  L TKA 07/19/2019    Referring Provider (PT)  Frankey Shown, MD    Next MD Visit  5/18      PROM   Left Knee Extension  5    Left Knee Flexion  105                   [facility] Adult PT Treatment/Exercise - 10/05/19 0001      Knee/Hip Exercises:  Stretches   Active Hamstring Stretch  Left;3 reps;30 seconds    Active Hamstring Stretch Limitations  seated    Quad Stretch  Left;3 reps;30 seconds    Quad Stretch Limitations  supine with strap leg off Eob    Knee: Self-Stretch Limitations  lunge stretch with foot in chair 10 sec X 10 reps      Knee/Hip Exercises: Aerobic   Nustep  bike occupied so performed nu step warm up L 6 seat 10 X 7 min      Knee/Hip Exercises: Machines for Strengthening   Total Gym Leg Press  56 lbs SL press with slow eccentric lower and stretch at bottom 2X15, then bilat push 106 lbs 2X15      Knee/Hip Exercises: Standing   Forward Step Up  Left;15 reps;Hand Hold: 1;Step Height: 8"    Other Standing Knee Exercises  TRX deep squat 2X12, TRX lunges X 12 reps bilat      Knee/Hip Exercises:  Seated   Heel Slides  Left    Heel Slides Limitations  10 seconds X 10 reps with self O.P sliding body up      Manual Therapy   Manual therapy comments  Lt knee flexion with Rt LAQ 3x10, supine PROM flexion and extension, Mobs for flexion and extension, manual hamstring stretching                  PT Long Term Goals - 10/05/19 0921      PT LONG TERM GOAL #1   Title  Pt will be independent in his HEP and progression.    Baseline  initial program issued on 08/10/2019    Time  8    Period  Weeks    Status  Achieved      PT LONG TERM GOAL #2   Title  Pt will be able to improve his L knee flexion to >/= 120 degrees.    Baseline  5-105 PROM    Time  8    Period  Weeks    Status  On-going      PT LONG TERM GOAL #3   Title  Pt will be able to amb >/= 1000 feet with no assistive device with step through gait pattern.    Baseline  now community ambulator, no AD    Time  8    Period  Weeks    Status  Achieved      PT LONG TERM GOAL #4   Title  pt will be albe bend and pick up objects off the floor without difficulty.    Baseline  now met    Time  8    Period  Weeks    Status  Achieved      PT LONG TERM  GOAL #5   Title  Pt will be able to report improved sleeping by greater than 50%.    Baseline  now met    Time  8    Period  Weeks    Status  Achieved            Plan - 10/05/19 0941    Clinical Impression Statement  His PT plan of care is out today. He has met most of his PT goals but still misses ROM and mild functional strength deficits limiting his full function. He will benefit from continued PT, recommending 2 times per week for 4 more weeks.    Personal Factors and Comorbidities  Comorbidity 1    Comorbidities  OA, hyperthyroidism, L TKA on 07/19/2019    Examination-Activity Limitations  Sleep;Transfers;Stand;Stairs;Squat    Stability/Clinical Decision Making  Stable/Uncomplicated    Rehab Potential  Excellent    PT Frequency  2x / week    PT Duration  8 weeks    PT Treatment/Interventions  ADLs/Self Care Home Management;Cryotherapy;Electrical Stimulation;Gait training;Stair training;Functional mobility training;Neuromuscular re-education;Balance training;Therapeutic exercise;Therapeutic activities;Patient/family education;Manual techniques;Passive range of motion;Taping;Vasopneumatic Device    PT Next Visit Plan  progress with strength and ROM as able    PT Home Exercise Plan  [id number]    Consulted and Agree with Plan of Care  Patient       Patient will benefit from skilled therapeutic intervention in order to improve the following deficits and impairments:  Pain, Postural dysfunction, Decreased strength, Decreased range of motion, Decreased activity tolerance, Abnormal gait, Difficulty walking, Increased edema  Visit Diagnosis: Acute pain of left knee  Muscle weakness (generalized)  Stiffness of left knee, not elsewhere classified  Localized edema  Difficulty in walking, not elsewhere classified     Problem List Patient Active Problem List   Diagnosis Date Noted  . 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 Primary osteoarthritis of left knee  07/18/2019  . Bilateral primary osteoarthritis of knee 06/18/2018  . Hyperthyroidism 02/01/2016    [name] 10/05/2019, 10:00 AM  [facility] Physical Therapy [address] [place], [place], [address] Phone: [phone]   Fax:  [phone]  Name: Jordan Barajas MRN: [id number] Date of Birth: 08-16-1955

## 2019-10-07 ENCOUNTER — Other Ambulatory Visit: Payer: Self-pay

## 2019-10-07 ENCOUNTER — Encounter: Payer: Self-pay | Admitting: Physical Therapy

## 2019-10-07 ENCOUNTER — Ambulatory Visit (INDEPENDENT_AMBULATORY_CARE_PROVIDER_SITE_OTHER): Payer: BC Managed Care – PPO | Admitting: Physical Therapy

## 2019-10-07 DIAGNOSIS — R600 Localized edema: Secondary | ICD-10-CM

## 2019-10-07 DIAGNOSIS — M25662 Stiffness of left knee, not elsewhere classified: Secondary | ICD-10-CM | POA: Diagnosis not present

## 2019-10-07 DIAGNOSIS — M6281 Muscle weakness (generalized): Secondary | ICD-10-CM

## 2019-10-07 DIAGNOSIS — M25562 Pain in left knee: Secondary | ICD-10-CM | POA: Diagnosis not present

## 2019-10-07 DIAGNOSIS — R262 Difficulty in walking, not elsewhere classified: Secondary | ICD-10-CM

## 2019-10-07 NOTE — Therapy (Signed)
[place], [place], [address] Phone: [phone]   Fax:  [phone]  Physical Therapy Treatment  Patient Details  Name: Jordan Barajas MRN: [id number] Date of Birth: Jul 21, 1955 Referring Provider (PT): Frankey Shown, MD   Encounter Date: 10/07/2019  PT End of Session - 10/07/19 0946    Visit Number  15    Number of Visits  24    Date for PT Re-Evaluation  11/09/19    Authorization - Visit Number  15    Authorization - Number of Visits  30    PT Start Time  0962    PT Stop Time  0933    PT Time Calculation (min)  46 min    Activity Tolerance  Patient tolerated treatment well    Behavior During Therapy  [facility] for tasks assessed/performed       Past Medical History:  Diagnosis Date  . Arthritis   . Hyperthyroidism 02/01/2016    Past Surgical History:  Procedure Laterality Date  . APPENDECTOMY     as a child  . TOTAL KNEE ARTHROPLASTY Left 07/19/2019   Procedure: LEFT TOTAL KNEE ARTHROPLASTY;  Surgeon: Leandrew Koyanagi, MD;  Location: [place];  Service: Orthopedics;  Laterality: Left;    There were no vitals filed for this visit.  Subjective Assessment - 10/07/19 0943    Subjective  not having much pain but having stiffness in the front of my knee with bending    Pertinent History  OA, L TKA 07/19/2019, hyperlipidemia    Limitations  Standing;Walking    Patient Stated Goals  Get back to work, walk without walker    Pain Onset  More than a month ago         [facility] PT Assessment - 10/07/19 0001      Assessment   Medical Diagnosis  L TKA 07/19/2019    Referring Provider (PT)  Frankey Shown, MD    Next MD Visit  5/18      AROM   Left Knee Extension  5    Left Knee Flexion  99      PROM   Left Knee Extension  4    Left Knee Flexion  105      Strength   Left Knee Flexion  4+/5    Left Knee Extension  4+/5        [facility] Adult PT Treatment/Exercise - 10/07/19 0001      Knee/Hip Exercises: Stretches   Active  Hamstring Stretch  Left;3 reps;30 seconds    Active Hamstring Stretch Limitations  seated    Quad Stretch  Left;3 reps;30 seconds    Quad Stretch Limitations  supine with strap leg off Eob    Knee: Self-Stretch Limitations  lunge stretch seated with foot in chair 20 sec X 5 reps      Knee/Hip Exercises: Aerobic   Recumbent Bike  8 min L2      Knee/Hip Exercises: Machines for Strengthening   Total Gym Leg Press  56 lbs SL press with slow eccentric lower and stretch at bottom 2X15, then bilat push 112 lbs 2X15      Knee/Hip Exercises: Standing   Forward Step Up  Left;2 sets;10 reps;Hand Hold: 0;Step Height: 8"    Other Standing Knee Exercises  goblet squats with 15 lbs at chair 2X10, deadlift from floor 20 lb KB 2X10      Manual Therapy   Manual therapy comments  Lt knee PROM, patella mobs, flexion/extension mobs  to tolerance                  PT Long Term Goals - 10/05/19 0921      PT LONG TERM GOAL #1   Title  Pt will be independent in his HEP and progression.    Baseline  initial program issued on 08/10/2019    Time  8    Period  Weeks    Status  Achieved      PT LONG TERM GOAL #2   Title  Pt will be able to improve his L knee flexion to >/= 120 degrees.    Baseline  5-105 PROM    Time  8    Period  Weeks    Status  On-going      PT LONG TERM GOAL #3   Title  Pt will be able to amb >/= 1000 feet with no assistive device with step through gait pattern.    Baseline  now community ambulator, no AD    Time  8    Period  Weeks    Status  Achieved      PT LONG TERM GOAL #4   Title  pt will be albe bend and pick up objects off the floor without difficulty.    Baseline  now met    Time  8    Period  Weeks    Status  Achieved      PT LONG TERM GOAL #5   Title  Pt will be able to report improved sleeping by greater than 50%.    Baseline  now met    Time  8    Period  Weeks    Status  Achieved            Plan - 10/07/19 0947    Clinical Impression  Statement  Overall doing well with PT, but does still lack knee ROM and strength but this is overall improving. He will benefit from continued PT to address his functional deficits and allow for safer return back to work duties.    Personal Factors and Comorbidities  Comorbidity 1    Comorbidities  OA, hyperthyroidism, L TKA on 07/19/2019    Examination-Activity Limitations  Sleep;Transfers;Stand;Stairs;Squat    Stability/Clinical Decision Making  Stable/Uncomplicated    Rehab Potential  Excellent    PT Frequency  2x / week    PT Duration  8 weeks    PT Treatment/Interventions  ADLs/Self Care Home Management;Cryotherapy;Electrical Stimulation;Gait training;Stair training;Functional mobility training;Neuromuscular re-education;Balance training;Therapeutic exercise;Therapeutic activities;Patient/family education;Manual techniques;Passive range of motion;Taping;Vasopneumatic Device    PT Next Visit Plan  progress with strength and ROM as able    PT Home Exercise Plan  [id number]    Consulted and Agree with Plan of Care  Patient       Patient will benefit from skilled therapeutic intervention in order to improve the following deficits and impairments:  Pain, Postural dysfunction, Decreased strength, Decreased range of motion, Decreased activity tolerance, Abnormal gait, Difficulty walking, Increased edema  Visit Diagnosis: Acute pain of left knee  Muscle weakness (generalized)  Stiffness of left knee, not elsewhere classified  Localized edema  Difficulty in walking, not elsewhere classified     Problem List Patient Active Problem List   Diagnosis Date Noted  . 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 Primary osteoarthritis of left knee 07/18/2019  . Bilateral primary osteoarthritis of knee 06/18/2018  . Hyperthyroidism 02/01/2016    [name] 10/07/2019, 9:50 AM  [facility] Physical Therapy [address] [place], [place],  [address] Phone: [phone]   Fax:  [phone]  Name: Taylin Leder MRN: [id number] Date of Birth: [birth year]

## 2019-10-12 ENCOUNTER — Other Ambulatory Visit: Payer: Self-pay

## 2019-10-12 ENCOUNTER — Encounter: Payer: Self-pay | Admitting: Orthopaedic Surgery

## 2019-10-12 ENCOUNTER — Ambulatory Visit: Payer: BC Managed Care – PPO | Admitting: Orthopaedic Surgery

## 2019-10-12 DIAGNOSIS — Z96652 Presence of left artificial knee joint: Secondary | ICD-10-CM

## 2019-10-12 NOTE — Progress Notes (Signed)
Office Visit Note   Patient: Jordan Barajas           Date of Birth: [birth year]           MRN: [id number] Visit Date: 10/12/2019              Requested by: Georgann Housekeeper, MD [address] [place],  [place] [address] PCP: Georgann Housekeeper, MD   Assessment & Plan: Visit Diagnoses:  1. Status post total left knee replacement     Plan: Jordan Barajas has done well from his knee replacement.  After discussion of his job I think that he will need some more time before he can return to his previous duties that require a lot of kneeling which is difficult after the surgery.  We will recheck him in 2 months for this with two-view x-rays of the left knee.  For the right knee he will treat this symptomatically with over-the-counter NSAIDs for now.  I will see him back as needed for this.  Follow-Up Instructions: Return in about 2 months (around 12/12/2019).   Orders:  No orders of the defined types were placed in this encounter.  No orders of the defined types were placed in this encounter.     Procedures: No procedures performed   Clinical Data: No additional findings.   Subjective: Chief Complaint  Patient presents with  . Left Knee - Pain    Jordan Barajas is 12 weeks status post left total knee replacement.  He is doing physical therapy has some pain at times but overall he is very happy.  He states that his right knee is also bothering him and wants to watch it for now.  He does have some scar sensitivity with kneeling.  Takes ibuprofen as needed.   Review of Systems  Constitutional: Negative.   All other systems reviewed and are negative.    Objective: Vital Signs: There were no vitals taken for this visit.  Physical Exam Vitals and nursing note reviewed.  Constitutional:      Appearance: He is well-developed.  Pulmonary:     Effort: Pulmonary effort is normal.  Abdominal:     Palpations: Abdomen is soft.  Skin:    General: Skin is warm.  Neurological:     Mental Status: He is alert and oriented to person, place, and time.  Psychiatric:        Behavior: Behavior normal.        Thought Content: Thought content normal.        Judgment: Judgment normal.     Ortho Exam Left knee shows a fully healed surgical scar.  He has near full extension and about 95 degrees of flexion.  Collaterals are stable. Right knee exam is unchanged. Specialty Comments:  No specialty comments available.  Imaging: No results found.   PMFS History: Patient Active Problem List   Diagnosis Date Noted  . 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 Primary osteoarthritis of left knee 07/18/2019  . Bilateral primary osteoarthritis of knee 06/18/2018  . Hyperthyroidism 02/01/2016   Past Medical History:  Diagnosis Date  . Arthritis   . Hyperthyroidism 02/01/2016    Family History  Problem Relation Age of Onset  . Thyroid disease Neg Hx     Past Surgical History:  Procedure Laterality Date  . APPENDECTOMY     as a child  . TOTAL KNEE ARTHROPLASTY Left 07/19/2019   Procedure: LEFT TOTAL KNEE ARTHROPLASTY;  Surgeon: Tarry Kos,  MD;  Location: [place];  Service: Orthopedics;  Laterality: Left;   Social History   Occupational History  . Not on file  Tobacco Use  . Smoking status: Never Smoker  . Smokeless tobacco: Never Used  Substance and Sexual Activity  . Alcohol use: No  . Drug use: No  . Sexual activity: Not on file

## 2019-10-18 ENCOUNTER — Other Ambulatory Visit: Payer: Self-pay

## 2019-10-18 ENCOUNTER — Ambulatory Visit (INDEPENDENT_AMBULATORY_CARE_PROVIDER_SITE_OTHER): Payer: BC Managed Care – PPO | Admitting: Physical Therapy

## 2019-10-18 DIAGNOSIS — M25662 Stiffness of left knee, not elsewhere classified: Secondary | ICD-10-CM | POA: Diagnosis not present

## 2019-10-18 DIAGNOSIS — M25562 Pain in left knee: Secondary | ICD-10-CM | POA: Diagnosis not present

## 2019-10-18 DIAGNOSIS — R262 Difficulty in walking, not elsewhere classified: Secondary | ICD-10-CM

## 2019-10-18 DIAGNOSIS — R600 Localized edema: Secondary | ICD-10-CM

## 2019-10-18 DIAGNOSIS — M6281 Muscle weakness (generalized): Secondary | ICD-10-CM | POA: Diagnosis not present

## 2019-10-18 NOTE — Therapy (Signed)
[place], [place], [address] Phone: [phone]   Fax:  [phone]  Physical Therapy Treatment  Patient Details  Name: Jordan Barajas MRN: [id number] Date of Birth: [birth year] Referring Provider (PT): Frankey Shown, MD   Encounter Date: 10/18/2019  PT End of Session - 10/18/19 1242    Visit Number  16    Number of Visits  24    Date for PT Re-Evaluation  11/09/19    Authorization - Visit Number  31    Authorization - Number of Visits  30    PT Start Time  7902    PT Stop Time  1230    PT Time Calculation (min)  45 min    Activity Tolerance  Patient tolerated treatment well    Behavior During Therapy  [facility] for tasks assessed/performed       Past Medical History:  Diagnosis Date  . Arthritis   . Hyperthyroidism 02/01/2016    Past Surgical History:  Procedure Laterality Date  . APPENDECTOMY     as a child  . TOTAL KNEE ARTHROPLASTY Left 07/19/2019   Procedure: LEFT TOTAL KNEE ARTHROPLASTY;  Surgeon: Leandrew Koyanagi, MD;  Location: [place];  Service: Orthopedics;  Laterality: Left;    There were no vitals filed for this visit.  Subjective Assessment - 10/18/19 1239    Subjective  not much pain, still some complaints of knee stiffness and some soreness    Pertinent History  OA, L TKA 07/19/2019, hyperlipidemia    Limitations  Standing;Walking    Patient Stated Goals  Get back to work, walk without walker    Pain Onset  More than a month ago                        [facility] Adult PT Treatment/Exercise - 10/18/19 0001      Knee/Hip Exercises: Stretches   Active Hamstring Stretch  Left;3 reps;30 seconds    Active Hamstring Stretch Limitations  seated    Quad Stretch  Left;3 reps;30 seconds    Quad Stretch Limitations  supine with strap leg off Eob    Knee: Self-Stretch Limitations  lunge stretch seated with foot in chair 20 sec X 5 reps    Other Knee/Hip Stretches  kneeling on his knee in chair on airex  pad 1 min weight shifts lateral then 1 minute sitting back into flexion stretching      Knee/Hip Exercises: Aerobic   Recumbent Bike  8 min L3      Knee/Hip Exercises: Machines for Strengthening   Total Gym Leg Press  56 lbs SL press with slow eccentric lower and stretch at bottom 2X15, then bilat push 118 lbs 2X15      Knee/Hip Exercises: Standing   Lateral Step Up  Both;15 reps;Hand Hold: 1;Step Height: 8"    Forward Step Up  Left;2 sets;10 reps;Hand Hold: 0;Step Height: 8"    Other Standing Knee Exercises  goblet squats with 15 lbs at chair 2X10, deadlift from floor 20 lb KB 2X10      Manual Therapy   Manual therapy comments  Lt knee PROM, patella mobs, flexion/extension mobs to tolerance                  PT Long Term Goals - 10/05/19 0921      PT LONG TERM GOAL #1   Title  Pt will be independent in his HEP and progression.    Baseline  initial program issued on 08/10/2019    Time  8    Period  Weeks    Status  Achieved      PT LONG TERM GOAL #2   Title  Pt will be able to improve his L knee flexion to >/= 120 degrees.    Baseline  5-105 PROM    Time  8    Period  Weeks    Status  On-going      PT LONG TERM GOAL #3   Title  Pt will be able to amb >/= 1000 feet with no assistive device with step through gait pattern.    Baseline  now community ambulator, no AD    Time  8    Period  Weeks    Status  Achieved      PT LONG TERM GOAL #4   Title  pt will be albe bend and pick up objects off the floor without difficulty.    Baseline  now met    Time  8    Period  Weeks    Status  Achieved      PT LONG TERM GOAL #5   Title  Pt will be able to report improved sleeping by greater than 50%.    Baseline  now met    Time  8    Period  Weeks    Status  Achieved            Plan - 10/18/19 1242    Clinical Impression Statement  extension ROM progressing well but continues to lack flexion ROM. Overall strength is improving as expected. Continue POC     Personal Factors and Comorbidities  Comorbidity 1    Comorbidities  OA, hyperthyroidism, L TKA on 07/19/2019    Examination-Activity Limitations  Sleep;Transfers;Stand;Stairs;Squat    Stability/Clinical Decision Making  Stable/Uncomplicated    Rehab Potential  Excellent    PT Frequency  2x / week    PT Duration  8 weeks    PT Treatment/Interventions  ADLs/Self Care Home Management;Cryotherapy;Electrical Stimulation;Gait training;Stair training;Functional mobility training;Neuromuscular re-education;Balance training;Therapeutic exercise;Therapeutic activities;Patient/family education;Manual techniques;Passive range of motion;Taping;Vasopneumatic Device    PT Next Visit Plan  progress with strength and ROM as able    PT Home Exercise Plan  [email or web address]    Consulted and Agree with Plan of Care  Patient       Patient will benefit from skilled therapeutic intervention in order to improve the following deficits and impairments:  Pain, Postural dysfunction, Decreased strength, Decreased range of motion, Decreased activity tolerance, Abnormal gait, Difficulty walking, Increased edema  Visit Diagnosis: Acute pain of left knee  Muscle weakness (generalized)  Stiffness of left knee, not elsewhere classified  Localized edema  Difficulty in walking, not elsewhere classified     Problem List Patient Active Problem List   Diagnosis Date Noted  . 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 Primary osteoarthritis of left knee 07/18/2019  . Bilateral primary osteoarthritis of knee 06/18/2018  . Hyperthyroidism 02/01/2016    Jordan Barajas 10/18/2019, 12:44 PM  [facility] Physical Therapy [address] [place], [place], [address] Phone: [phone]   Fax:  [phone]  Name: Jordan Barajas MRN: [id number] Date of Birth: 07-Apr-1956

## 2019-10-20 ENCOUNTER — Ambulatory Visit (INDEPENDENT_AMBULATORY_CARE_PROVIDER_SITE_OTHER): Payer: BC Managed Care – PPO | Admitting: Physical Therapy

## 2019-10-20 ENCOUNTER — Other Ambulatory Visit: Payer: Self-pay

## 2019-10-20 DIAGNOSIS — R600 Localized edema: Secondary | ICD-10-CM

## 2019-10-20 DIAGNOSIS — M6281 Muscle weakness (generalized): Secondary | ICD-10-CM

## 2019-10-20 DIAGNOSIS — M25662 Stiffness of left knee, not elsewhere classified: Secondary | ICD-10-CM

## 2019-10-20 DIAGNOSIS — R262 Difficulty in walking, not elsewhere classified: Secondary | ICD-10-CM

## 2019-10-20 DIAGNOSIS — M25562 Pain in left knee: Secondary | ICD-10-CM

## 2019-10-20 NOTE — Therapy (Signed)
[place], [place], [address] Phone: [phone]   Fax:  [phone]  Physical Therapy Treatment  Patient Details  Name: Jordan Barajas MRN: [id number] Date of Birth: 1955-06-18 Referring Provider (PT): Frankey Shown, MD   Encounter Date: 10/20/2019  PT End of Session - 10/20/19 1157    Visit Number  17    Number of Visits  24    Date for PT Re-Evaluation  11/09/19    Authorization - Visit Number  47    Authorization - Number of Visits  30    PT Start Time  6384    PT Stop Time  1230    PT Time Calculation (min)  45 min    Activity Tolerance  Patient tolerated treatment well    Behavior During Therapy  [facility] for tasks assessed/performed       Past Medical History:  Diagnosis Date  . Arthritis   . Hyperthyroidism 02/01/2016    Past Surgical History:  Procedure Laterality Date  . APPENDECTOMY     as a child  . TOTAL KNEE ARTHROPLASTY Left 07/19/2019   Procedure: LEFT TOTAL KNEE ARTHROPLASTY;  Surgeon: Leandrew Koyanagi, MD;  Location: [place];  Service: Orthopedics;  Laterality: Left;    There were no vitals filed for this visit.  Subjective Assessment - 10/20/19 1156    Subjective  not really having pain in his Lt knee today, but Rt knee is hurting.    Pertinent History  OA, L TKA 07/19/2019, hyperlipidemia    Limitations  Standing;Walking    Patient Stated Goals  Get back to work, walk without walker    Pain Onset  More than a month ago         [facility] PT Assessment - 10/20/19 0001      Assessment   Medical Diagnosis  L TKA 07/19/2019    Referring Provider (PT)  Frankey Shown, MD      AROM   Left Knee Extension  4    Left Knee Flexion  100      PROM   Left Knee Flexion  109      Strength   Left Knee Flexion  4+/5    Left Knee Extension  4+/5       [facility] Adult PT Treatment/Exercise - 10/20/19 0001      Knee/Hip Exercises: Stretches   Active Hamstring Stretch  Left;3 reps;30 seconds    Active Hamstring  Stretch Limitations  seated    Quad Stretch  Left;3 reps;30 seconds    Quad Stretch Limitations  supine with strap leg off Eob    Knee: Self-Stretch Limitations  lunge stretch seated with foot in chair 20 sec X 5 reps    Other Knee/Hip Stretches  kneeling on his knee in chair on airex pad 1 min weight shifts lateral then 1 minute sitting back into flexion stretching      Knee/Hip Exercises: Aerobic   Recumbent Bike  8 min L3      Knee/Hip Exercises: Machines for Strengthening   Total Gym Leg Press  56 lbs SL press with slow eccentric lower and stretch at bottom 2X15, then bilat push 118 lbs 2X15      Knee/Hip Exercises: Standing   Lateral Step Up  Both;15 reps;Hand Hold: 1;Step Height: 8"    Forward Step Up  Left;2 sets;10 reps;Hand Hold: 0;Step Height: 8"    Other Standing Knee Exercises  goblet squats with 15 lbs at chair 2X10, deadlift from floor  20 lb KB 2X10      Manual Therapy   Manual therapy comments  Lt knee PROM, patella mobs, flexion/extension mobs to tolerance                  PT Long Term Goals - 10/05/19 0921      PT LONG TERM GOAL #1   Title  Pt will be independent in his HEP and progression.    Baseline  initial program issued on 08/10/2019    Time  8    Period  Weeks    Status  Achieved      PT LONG TERM GOAL #2   Title  Pt will be able to improve his L knee flexion to >/= 120 degrees.    Baseline  5-105 PROM    Time  8    Period  Weeks    Status  On-going      PT LONG TERM GOAL #3   Title  Pt will be able to amb >/= 1000 feet with no assistive device with step through gait pattern.    Baseline  now community ambulator, no AD    Time  8    Period  Weeks    Status  Achieved      PT LONG TERM GOAL #4   Title  pt will be albe bend and pick up objects off the floor without difficulty.    Baseline  now met    Time  8    Period  Weeks    Status  Achieved      PT LONG TERM GOAL #5   Title  Pt will be able to report improved sleeping by greater  than 50%.    Baseline  now met    Time  8    Period  Weeks    Status  Achieved            Plan - 10/20/19 1334    Clinical Impression Statement  He continues to do well with PT and is is having less pain and stiffnes overall in his knee post op. PT will continue to progress as able toward his functional goals and to return to work.    Personal Factors and Comorbidities  Comorbidity 1    Comorbidities  OA, hyperthyroidism, L TKA on 07/19/2019    Examination-Activity Limitations  Sleep;Transfers;Stand;Stairs;Squat    Stability/Clinical Decision Making  Stable/Uncomplicated    Rehab Potential  Excellent    PT Frequency  2x / week    PT Duration  8 weeks    PT Treatment/Interventions  ADLs/Self Care Home Management;Cryotherapy;Electrical Stimulation;Gait training;Stair training;Functional mobility training;Neuromuscular re-education;Balance training;Therapeutic exercise;Therapeutic activities;Patient/family education;Manual techniques;Passive range of motion;Taping;Vasopneumatic Device    PT Next Visit Plan  progress with strength and ROM as able    PT Home Exercise Plan  [id number]    Consulted and Agree with Plan of Care  Patient       Patient will benefit from skilled therapeutic intervention in order to improve the following deficits and impairments:  Pain, Postural dysfunction, Decreased strength, Decreased range of motion, Decreased activity tolerance, Abnormal gait, Difficulty walking, Increased edema  Visit Diagnosis: Acute pain of left knee  Muscle weakness (generalized)  Stiffness of left knee, not elsewhere classified  Localized edema  Difficulty in walking, not elsewhere classified     Problem List Patient Active Problem List   Diagnosis Date Noted  . 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 Primary osteoarthritis of left  knee 07/18/2019  . Bilateral primary osteoarthritis of knee 06/18/2018  . Hyperthyroidism 02/01/2016    [name] 10/20/2019, 1:36 PM  [facility] Physical Therapy [address] [place], [place], [address] Phone: [phone]   Fax:  [phone]  Name: Jordan Barajas MRN: [id number] Date of Birth: [birth year]

## 2019-10-27 ENCOUNTER — Telehealth: Payer: Self-pay | Admitting: Orthopaedic Surgery

## 2019-10-27 ENCOUNTER — Other Ambulatory Visit: Payer: Self-pay

## 2019-10-27 ENCOUNTER — Ambulatory Visit (INDEPENDENT_AMBULATORY_CARE_PROVIDER_SITE_OTHER): Payer: BC Managed Care – PPO | Admitting: Physical Therapy

## 2019-10-27 DIAGNOSIS — R600 Localized edema: Secondary | ICD-10-CM | POA: Diagnosis not present

## 2019-10-27 DIAGNOSIS — M6281 Muscle weakness (generalized): Secondary | ICD-10-CM

## 2019-10-27 DIAGNOSIS — M25662 Stiffness of left knee, not elsewhere classified: Secondary | ICD-10-CM | POA: Diagnosis not present

## 2019-10-27 DIAGNOSIS — R262 Difficulty in walking, not elsewhere classified: Secondary | ICD-10-CM

## 2019-10-27 DIAGNOSIS — M25562 Pain in left knee: Secondary | ICD-10-CM

## 2019-10-27 NOTE — Telephone Encounter (Signed)
Patient stopped by.   He is wanting to follow up on paperwork that [employer] sent over on his behalf to be filled out by Dr.Xu.  Call back: [phone]

## 2019-10-27 NOTE — Therapy (Signed)
[place], [place], [address] Phone: [phone]   Fax:  [phone]  Physical Therapy Treatment  Patient Details  Name: Jordan Barajas MRN: [id number] Date of Birth: 1956/02/15 Referring Provider (PT): Frankey Shown, MD   Encounter Date: 10/27/2019  PT End of Session - 10/27/19 0939    Visit Number  18    Number of Visits  24    Date for PT Re-Evaluation  11/09/19    Authorization - Visit Number  18    Authorization - Number of Visits  30    PT Start Time  4327    PT Stop Time  0931    PT Time Calculation (min)  44 min    Activity Tolerance  Patient tolerated treatment well    Behavior During Therapy  [facility] for tasks assessed/performed       Past Medical History:  Diagnosis Date  . Arthritis   . Hyperthyroidism 02/01/2016    Past Surgical History:  Procedure Laterality Date  . APPENDECTOMY     as a child  . TOTAL KNEE ARTHROPLASTY Left 07/19/2019   Procedure: LEFT TOTAL KNEE ARTHROPLASTY;  Surgeon: Leandrew Koyanagi, MD;  Location: [place];  Service: Orthopedics;  Laterality: Left;    There were no vitals filed for this visit.  Subjective Assessment - 10/27/19 0939    Subjective  2-3/10 overall Lt knee pain today    Pertinent History  OA, L TKA 07/19/2019, hyperlipidemia    Limitations  Standing;Walking    Patient Stated Goals  Get back to work, walk without walker    Pain Onset  More than a month ago         [facility] PT Assessment - 10/27/19 0001      Assessment   Medical Diagnosis  L TKA 07/19/2019    Referring Provider (PT)  Frankey Shown, MD      AROM   Left Knee Extension  4    Left Knee Flexion  109      PROM   Left Knee Flexion  114       [facility] Adult PT Treatment/Exercise - 10/27/19 0001      Knee/Hip Exercises: Stretches   Active Hamstring Stretch  Left;3 reps;30 seconds    Active Hamstring Stretch Limitations  seated    Knee: Self-Stretch Limitations  lunge stretch seated with foot in chair 20 sec  X 5 reps    Soleus Stretch Limitations  slantboard 30 sec X 3    Other Knee/Hip Stretches  seated heelslides 30 sec X 3 with self O.P      Knee/Hip Exercises: Aerobic   Recumbent Bike  6 min L5      Knee/Hip Exercises: Machines for Strengthening   Cybex Knee Extension  10 lbs bilat 3X10    Cybex Knee Flexion  25 SL 2X10, 45 DL 2X10      Knee/Hip Exercises: Standing   Other Standing Knee Exercises  goblet squats with 20 lbs at chair 2X10, deadlift from floor 25 lb KB 2X10      Manual Therapy   Manual therapy comments  Lt knee PROM, patella mobs, flexion/extension mobs to tolerance        PT Long Term Goals - 10/27/19 1000      PT LONG TERM GOAL #1   Title  Pt will be independent in his HEP and progression.    Baseline  initial program issued on 08/10/2019    Time  8  Period  Weeks    Status  Achieved      PT LONG TERM GOAL #2   Title  Pt will be able to improve his L knee flexion to >/= 120 degrees.    Baseline  4-114 PROM    Time  8    Period  Weeks    Status  On-going      PT LONG TERM GOAL #3   Title  Pt will be able to amb >/= 1000 feet with no assistive device with step through gait pattern.    Baseline  now community ambulator, no AD    Time  8    Period  Weeks    Status  Achieved      PT LONG TERM GOAL #4   Title  pt will be albe bend and pick up objects off the floor without difficulty.    Baseline  now met    Time  8    Period  Weeks    Status  Achieved      PT LONG TERM GOAL #5   Title  Pt will be able to report improved sleeping by greater than 50%.    Baseline  now met    Time  8    Period  Weeks    Status  Achieved            Plan - 10/27/19 0959    Clinical Impression Statement  ROM improvments noted today for his left knee. He was able to progress his functional strength program without complaints. Continue POC    Personal Factors and Comorbidities  Comorbidity 1    Comorbidities  OA, hyperthyroidism, L TKA on 07/19/2019     Examination-Activity Limitations  Sleep;Transfers;Stand;Stairs;Squat    Stability/Clinical Decision Making  Stable/Uncomplicated    Rehab Potential  Excellent    PT Frequency  2x / week    PT Duration  8 weeks    PT Treatment/Interventions  ADLs/Self Care Home Management;Cryotherapy;Electrical Stimulation;Gait training;Stair training;Functional mobility training;Neuromuscular re-education;Balance training;Therapeutic exercise;Therapeutic activities;Patient/family education;Manual techniques;Passive range of motion;Taping;Vasopneumatic Device    PT Next Visit Plan  progress with strength and ROM as able    PT Home Exercise Plan  [email or web address]    Consulted and Agree with Plan of Care  Patient       Patient will benefit from skilled therapeutic intervention in order to improve the following deficits and impairments:  Pain, Postural dysfunction, Decreased strength, Decreased range of motion, Decreased activity tolerance, Abnormal gait, Difficulty walking, Increased edema  Visit Diagnosis: Acute pain of left knee  Muscle weakness (generalized)  Stiffness of left knee, not elsewhere classified  Localized edema  Difficulty in walking, not elsewhere classified     Problem List Patient Active Problem List   Diagnosis Date Noted  . 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 Primary osteoarthritis of left knee 07/18/2019  . Bilateral primary osteoarthritis of knee 06/18/2018  . Hyperthyroidism 02/01/2016    Debbe Odea, PT,DPT 10/27/2019, 10:03 AM  [facility] Physical Therapy [address] [place], [place], [address] Phone: [phone]   Fax:  [phone]  Name: Jordan Barajas MRN: [id number] Date of Birth: [birth year]

## 2019-10-29 ENCOUNTER — Ambulatory Visit (INDEPENDENT_AMBULATORY_CARE_PROVIDER_SITE_OTHER): Payer: BC Managed Care – PPO | Admitting: Physical Therapy

## 2019-10-29 ENCOUNTER — Other Ambulatory Visit: Payer: Self-pay

## 2019-10-29 DIAGNOSIS — M25562 Pain in left knee: Secondary | ICD-10-CM

## 2019-10-29 DIAGNOSIS — M25662 Stiffness of left knee, not elsewhere classified: Secondary | ICD-10-CM | POA: Diagnosis not present

## 2019-10-29 DIAGNOSIS — R262 Difficulty in walking, not elsewhere classified: Secondary | ICD-10-CM

## 2019-10-29 DIAGNOSIS — M6281 Muscle weakness (generalized): Secondary | ICD-10-CM

## 2019-10-29 DIAGNOSIS — R600 Localized edema: Secondary | ICD-10-CM

## 2019-10-29 NOTE — Telephone Encounter (Signed)
Dr. Roda Shutters signed forms already.

## 2019-10-29 NOTE — Therapy (Signed)
[place], [place], [address] Phone: [phone]   Fax:  [phone]  Physical Therapy Treatment  Patient Details  Name: Jordan Barajas MRN: [id number] Date of Birth: [birth year] Referring Provider (PT): Frankey Shown, MD   Encounter Date: 10/29/2019  PT End of Session - 10/29/19 0917    Visit Number  19    Number of Visits  24    Date for PT Re-Evaluation  11/09/19    Authorization - Visit Number  45    Authorization - Number of Visits  30    PT Start Time  9622    PT Stop Time  0931    PT Time Calculation (min)  39 min    Activity Tolerance  Patient tolerated treatment well    Behavior During Therapy  [facility] for tasks assessed/performed       Past Medical History:  Diagnosis Date  . Arthritis   . Hyperthyroidism 02/01/2016    Past Surgical History:  Procedure Laterality Date  . APPENDECTOMY     as a child  . TOTAL KNEE ARTHROPLASTY Left 07/19/2019   Procedure: LEFT TOTAL KNEE ARTHROPLASTY;  Surgeon: Leandrew Koyanagi, MD;  Location: [place];  Service: Orthopedics;  Laterality: Left;    There were no vitals filed for this visit.  Subjective Assessment - 10/29/19 0907    Subjective  no pain upon arrival, 3/10 knee pain if he is standing for long period of time or if his knee is stretched    Pertinent History  OA, L TKA 07/19/2019, hyperlipidemia    Limitations  Standing;Walking    Patient Stated Goals  Get back to work, walk without walker    Pain Onset  More than a month ago          [facility] Adult PT Treatment/Exercise - 10/29/19 0001      Knee/Hip Exercises: Stretches   Active Hamstring Stretch  Left;3 reps;30 seconds    Active Hamstring Stretch Limitations  seated    Knee: Self-Stretch Limitations  lunge stretch seated with foot in chair 20 sec X 5 reps    Soleus Stretch Limitations  slantboard 30 sec X 3    Other Knee/Hip Stretches  seated heelslides 30 sec X 3 with self O.P      Knee/Hip Exercises: Aerobic   Recumbent Bike  6 min L5      Knee/Hip Exercises: Machines for Strengthening   Cybex Knee Extension  10 lbs bilat 3X10    Cybex Knee Flexion  35 SL 2X10, 55 DL 2X10      Knee/Hip Exercises: Standing   Other Standing Knee Exercises  goblet squats with 20 lbs at chair 2X10, deadlift from floor 25 lb KB 2X10      Manual Therapy   Manual therapy comments  Lt knee PROM, patella mobs, flexion/extension mobs to tolerance                  PT Long Term Goals - 10/27/19 1000      PT LONG TERM GOAL #1   Title  Pt will be independent in his HEP and progression.    Baseline  initial program issued on 08/10/2019    Time  8    Period  Weeks    Status  Achieved      PT LONG TERM GOAL #2   Title  Pt will be able to improve his L knee flexion to >/= 120 degrees.    Baseline  4-114 PROM  Time  8    Period  Weeks    Status  On-going      PT LONG TERM GOAL #3   Title  Pt will be able to amb >/= 1000 feet with no assistive device with step through gait pattern.    Baseline  now community ambulator, no AD    Time  8    Period  Weeks    Status  Achieved      PT LONG TERM GOAL #4   Title  pt will be albe bend and pick up objects off the floor without difficulty.    Baseline  now met    Time  8    Period  Weeks    Status  Achieved      PT LONG TERM GOAL #5   Title  Pt will be able to report improved sleeping by greater than 50%.    Baseline  now met    Time  8    Period  Weeks    Status  Achieved            Plan - 10/29/19 0932    Clinical Impression Statement  continued with knee strengthing and strengthing. Overall good pain tolerance but continues to have deficits with ROM and overall strength and PT will continue to progress as able.    Personal Factors and Comorbidities  Comorbidity 1    Comorbidities  OA, hyperthyroidism, L TKA on 07/19/2019    Examination-Activity Limitations  Sleep;Transfers;Stand;Stairs;Squat    Stability/Clinical Decision Making   Stable/Uncomplicated    Rehab Potential  Excellent    PT Frequency  2x / week    PT Duration  8 weeks    PT Treatment/Interventions  ADLs/Self Care Home Management;Cryotherapy;Electrical Stimulation;Gait training;Stair training;Functional mobility training;Neuromuscular re-education;Balance training;Therapeutic exercise;Therapeutic activities;Patient/family education;Manual techniques;Passive range of motion;Taping;Vasopneumatic Device    PT Next Visit Plan  progress with strength and ROM as able    PT Home Exercise Plan  [id number]    Consulted and Agree with Plan of Care  Patient       Patient will benefit from skilled therapeutic intervention in order to improve the following deficits and impairments:  Pain, Postural dysfunction, Decreased strength, Decreased range of motion, Decreased activity tolerance, Abnormal gait, Difficulty walking, Increased edema  Visit Diagnosis: Acute pain of left knee  Muscle weakness (generalized)  Stiffness of left knee, not elsewhere classified  Localized edema  Difficulty in walking, not elsewhere classified     Problem List Patient Active Problem List   Diagnosis Date Noted  . 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 Primary osteoarthritis of left knee 07/18/2019  . Bilateral primary osteoarthritis of knee 06/18/2018  . Hyperthyroidism 02/01/2016    Debbe Odea, PT,DPT 10/29/2019, 10:04 AM  [facility] Physical Therapy [address] [place], [place], [address] Phone: [phone]   Fax:  [phone]  Name: Gualberto Wahlen MRN: [id number] Date of Birth: [birth year]

## 2019-10-29 NOTE — Telephone Encounter (Signed)
This will go to Tammy and then [employer].

## 2019-11-02 ENCOUNTER — Ambulatory Visit (INDEPENDENT_AMBULATORY_CARE_PROVIDER_SITE_OTHER): Payer: BC Managed Care – PPO | Admitting: Physical Therapy

## 2019-11-02 ENCOUNTER — Other Ambulatory Visit: Payer: Self-pay

## 2019-11-02 DIAGNOSIS — M6281 Muscle weakness (generalized): Secondary | ICD-10-CM | POA: Diagnosis not present

## 2019-11-02 DIAGNOSIS — M25562 Pain in left knee: Secondary | ICD-10-CM | POA: Diagnosis not present

## 2019-11-02 DIAGNOSIS — M25662 Stiffness of left knee, not elsewhere classified: Secondary | ICD-10-CM

## 2019-11-02 DIAGNOSIS — R262 Difficulty in walking, not elsewhere classified: Secondary | ICD-10-CM

## 2019-11-02 DIAGNOSIS — R600 Localized edema: Secondary | ICD-10-CM | POA: Diagnosis not present

## 2019-11-02 NOTE — Therapy (Deleted)
[place], [place], [address] Phone: [phone]   Fax:  [phone]  Physical Therapy Treatment  Patient Details  Name: Jordan Barajas MRN: [id number] Date of Birth: Feb 13, 1956 Referring Provider (PT): Frankey Shown, MD   Encounter Date: 11/02/2019  PT End of Session - 11/02/19 0910    Visit Number  20    Number of Visits  24    Date for PT Re-Evaluation  11/09/19    Authorization - Visit Number  64    Authorization - Number of Visits  30    PT Start Time  0850    PT Stop Time  0915    PT Time Calculation (min)  25 min    Activity Tolerance  Patient tolerated treatment well    Behavior During Therapy  [facility] for tasks assessed/performed       Past Medical History:  Diagnosis Date  . Arthritis   . Hyperthyroidism 02/01/2016    Past Surgical History:  Procedure Laterality Date  . APPENDECTOMY     as a child  . TOTAL KNEE ARTHROPLASTY Left 07/19/2019   Procedure: LEFT TOTAL KNEE ARTHROPLASTY;  Surgeon: Leandrew Koyanagi, MD;  Location: [place];  Service: Orthopedics;  Laterality: Left;    There were no vitals filed for this visit.  Subjective Assessment - 11/02/19 0909    Subjective  relays his knee is doing overall well just stiff, he also relays he needs to leave early today    Pertinent History  OA, L TKA 07/19/2019, hyperlipidemia    Limitations  Standing;Walking    Patient Stated Goals  Get back to work, walk without walker    Pain Onset  More than a month ago             PT Long Term Goals - 10/27/19 1000      PT LONG TERM GOAL #1   Title  Pt will be independent in his HEP and progression.    Baseline  initial program issued on 08/10/2019    Time  8    Period  Weeks    Status  Achieved      PT LONG TERM GOAL #2   Title  Pt will be able to improve his L knee flexion to >/= 120 degrees.    Baseline  4-114 PROM    Time  8    Period  Weeks    Status  On-going      PT LONG TERM GOAL #3   Title  Pt will  be able to amb >/= 1000 feet with no assistive device with step through gait pattern.    Baseline  now community ambulator, no AD    Time  8    Period  Weeks    Status  Achieved      PT LONG TERM GOAL #4   Title  pt will be albe bend and pick up objects off the floor without difficulty.    Baseline  now met    Time  8    Period  Weeks    Status  Achieved      PT LONG TERM GOAL #5   Title  Pt will be able to report improved sleeping by greater than 50%.    Baseline  now met    Time  8    Period  Weeks    Status  Achieved            Plan - 11/02/19 3545  Clinical Impression Statement  Session ended early due to his request. He is overall continuing to progress with functional strength and ROM but has a demanding occupation and is not yet ready to return do work. PT will continue to progress him as tolerated.    Personal Factors and Comorbidities  Comorbidity 1    Comorbidities  OA, hyperthyroidism, L TKA on 07/19/2019    Examination-Activity Limitations  Sleep;Transfers;Stand;Stairs;Squat    Stability/Clinical Decision Making  Stable/Uncomplicated    Rehab Potential  Excellent    PT Frequency  2x / week    PT Duration  8 weeks    PT Treatment/Interventions  ADLs/Self Care Home Management;Cryotherapy;Electrical Stimulation;Gait training;Stair training;Functional mobility training;Neuromuscular re-education;Balance training;Therapeutic exercise;Therapeutic activities;Patient/family education;Manual techniques;Passive range of motion;Taping;Vasopneumatic Device    PT Next Visit Plan  progress with strength and ROM as able    PT Home Exercise Plan  [email or web address]    Consulted and Agree with Plan of Care  Patient       Patient will benefit from skilled therapeutic intervention in order to improve the following deficits and impairments:  Pain, Postural dysfunction, Decreased strength, Decreased range of motion, Decreased activity tolerance, Abnormal gait, Difficulty walking, Increased  edema  Visit Diagnosis: Acute pain of left knee  Muscle weakness (generalized)  Stiffness of left knee, not elsewhere classified  Localized edema  Difficulty in walking, not elsewhere classified     Problem List Patient Active Problem List   Diagnosis Date Noted  . 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 Primary osteoarthritis of left knee 07/18/2019  . Bilateral primary osteoarthritis of knee 06/18/2018  . Hyperthyroidism 02/01/2016    Debbe Odea 11/02/2019, 9:19 AM  [facility] Physical Therapy [address] [place], [place], [address] Phone: [phone]   Fax:  [phone]  Name: Jordan Barajas MRN: [id number] Date of Birth: 1956-03-01

## 2019-11-02 NOTE — Therapy (Signed)
[place], [place], [address] Phone: [phone]   Fax:  [phone]  Physical Therapy Treatment  Patient Details  Name: Jordan Barajas MRN: [id number] Date of Birth: 09-07-1955 Referring Provider (PT): Frankey Shown, MD   Encounter Date: 11/02/2019  PT End of Session - 11/02/19 0910    Visit Number  20    Number of Visits  24    Date for PT Re-Evaluation  11/09/19    Authorization - Visit Number  49    Authorization - Number of Visits  30    PT Start Time  0850    PT Stop Time  0915    PT Time Calculation (min)  25 min    Activity Tolerance  Patient tolerated treatment well    Behavior During Therapy  [facility] for tasks assessed/performed       Past Medical History:  Diagnosis Date  . Arthritis   . Hyperthyroidism 02/01/2016    Past Surgical History:  Procedure Laterality Date  . APPENDECTOMY     as a child  . TOTAL KNEE ARTHROPLASTY Left 07/19/2019   Procedure: LEFT TOTAL KNEE ARTHROPLASTY;  Surgeon: Leandrew Koyanagi, MD;  Location: [place];  Service: Orthopedics;  Laterality: Left;    There were no vitals filed for this visit.  Subjective Assessment - 11/02/19 0909    Subjective  relays his knee is doing overall well just stiff, he also relays he needs to leave early today    Pertinent History  OA, L TKA 07/19/2019, hyperlipidemia    Limitations  Standing;Walking    Patient Stated Goals  Get back to work, walk without walker    Pain Onset  More than a month ago                        [facility] Adult PT Treatment/Exercise - 11/02/19 0001      Knee/Hip Exercises: Stretches   Passive Hamstring Stretch  Left;3 reps;30 seconds    Knee: Self-Stretch Limitations  lunge stretch 10 seconds X 10 reps with foot on 6 inch step    Soleus Stretch Limitations  slantboard 30 sec X 3      Knee/Hip Exercises: Aerobic   Recumbent Bike  6 min L5 L3      Knee/Hip Exercises: Standing   Other Standing Knee Exercises   goblet squats with 20 lbs at chair 2X10, deadlift from floor 25 lb KB 2X10      Manual Therapy   Manual therapy comments  Lt knee PROM, patella mobs, flexion/extension mobs to tolerance                  PT Long Term Goals - 10/27/19 1000      PT LONG TERM GOAL #1   Title  Pt will be independent in his HEP and progression.    Baseline  initial program issued on 08/10/2019    Time  8    Period  Weeks    Status  Achieved      PT LONG TERM GOAL #2   Title  Pt will be able to improve his L knee flexion to >/= 120 degrees.    Baseline  4-114 PROM    Time  8    Period  Weeks    Status  On-going      PT LONG TERM GOAL #3   Title  Pt will be able to amb >/= 1000 feet with no assistive device  with step through gait pattern.    Baseline  now community ambulator, no AD    Time  8    Period  Weeks    Status  Achieved      PT LONG TERM GOAL #4   Title  pt will be albe bend and pick up objects off the floor without difficulty.    Baseline  now met    Time  8    Period  Weeks    Status  Achieved      PT LONG TERM GOAL #5   Title  Pt will be able to report improved sleeping by greater than 50%.    Baseline  now met    Time  8    Period  Weeks    Status  Achieved            Plan - 11/02/19 0071    Clinical Impression Statement  Session ended early due to his request. He is overall continuing to progress with functional strength and ROM but has a demanding occupation and is not yet ready to return do work. PT will continue to progress him as tolerated.    Personal Factors and Comorbidities  Comorbidity 1    Comorbidities  OA, hyperthyroidism, L TKA on 07/19/2019    Examination-Activity Limitations  Sleep;Transfers;Stand;Stairs;Squat    Stability/Clinical Decision Making  Stable/Uncomplicated    Rehab Potential  Excellent    PT Frequency  2x / week    PT Duration  8 weeks    PT Treatment/Interventions  ADLs/Self Care Home Management;Cryotherapy;Electrical  Stimulation;Gait training;Stair training;Functional mobility training;Neuromuscular re-education;Balance training;Therapeutic exercise;Therapeutic activities;Patient/family education;Manual techniques;Passive range of motion;Taping;Vasopneumatic Device    PT Next Visit Plan  progress with strength and ROM as able    PT Home Exercise Plan  [id number]    Consulted and Agree with Plan of Care  Patient       Patient will benefit from skilled therapeutic intervention in order to improve the following deficits and impairments:  Pain, Postural dysfunction, Decreased strength, Decreased range of motion, Decreased activity tolerance, Abnormal gait, Difficulty walking, Increased edema  Visit Diagnosis: Acute pain of left knee  Muscle weakness (generalized)  Stiffness of left knee, not elsewhere classified  Localized edema  Difficulty in walking, not elsewhere classified     Problem List Patient Active Problem List   Diagnosis Date Noted  . 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 Primary osteoarthritis of left knee 07/18/2019  . Bilateral primary osteoarthritis of knee 06/18/2018  . Hyperthyroidism 02/01/2016    Debbe Odea, PT,DPT 11/02/2019, 9:27 AM  [facility] Physical Therapy [address] [place], [place], [address] Phone: [phone]   Fax:  [phone]  Name: Jamesmichael Shadd MRN: [id number] Date of Birth: 1956/02/24

## 2019-11-04 ENCOUNTER — Ambulatory Visit (INDEPENDENT_AMBULATORY_CARE_PROVIDER_SITE_OTHER): Payer: BC Managed Care – PPO | Admitting: Physical Therapy

## 2019-11-04 ENCOUNTER — Other Ambulatory Visit: Payer: Self-pay

## 2019-11-04 DIAGNOSIS — M6281 Muscle weakness (generalized): Secondary | ICD-10-CM

## 2019-11-04 DIAGNOSIS — M25562 Pain in left knee: Secondary | ICD-10-CM

## 2019-11-04 DIAGNOSIS — R600 Localized edema: Secondary | ICD-10-CM

## 2019-11-04 DIAGNOSIS — R262 Difficulty in walking, not elsewhere classified: Secondary | ICD-10-CM

## 2019-11-04 DIAGNOSIS — M25662 Stiffness of left knee, not elsewhere classified: Secondary | ICD-10-CM

## 2019-11-04 NOTE — Therapy (Signed)
[place], [place], [address] Phone: [phone]   Fax:  [phone]  Physical Therapy Treatment  Patient Details  Name: Jordan Barajas MRN: [id number] Date of Birth: 03-28-1956 Referring Provider (PT): Frankey Shown, MD   Encounter Date: 11/04/2019   PT End of Session - 11/04/19 0929    Visit Number 21    Number of Visits 24    Date for PT Re-Evaluation 11/09/19    Authorization - Visit Number 21    Authorization - Number of Visits 30    PT Start Time 0850    PT Stop Time 0935    PT Time Calculation (min) 45 min    Activity Tolerance Patient tolerated treatment well    Behavior During Therapy [facility] for tasks assessed/performed           Past Medical History:  Diagnosis Date  . Arthritis   . Hyperthyroidism 02/01/2016    Past Surgical History:  Procedure Laterality Date  . APPENDECTOMY     as a child  . TOTAL KNEE ARTHROPLASTY Left 07/19/2019   Procedure: LEFT TOTAL KNEE ARTHROPLASTY;  Surgeon: Leandrew Koyanagi, MD;  Location: [place];  Service: Orthopedics;  Laterality: Left;    There were no vitals filed for this visit.   Subjective Assessment - 11/04/19 0934    Subjective no pain just really stiff today in his Lt knee    Pertinent History OA, L TKA 07/19/2019, hyperlipidemia    Limitations Standing;Walking    Patient Stated Goals Get back to work, walk without walker    Pain Onset More than a month ago              [facility] PT Assessment - 11/04/19 0001      Assessment   Medical Diagnosis L TKA 07/19/2019    Referring Provider (PT) Frankey Shown, MD      AROM   Left Knee Extension 3    Left Knee Flexion 110      Strength   Left Knee Flexion 4+/5    Left Knee Extension 4+/5               [facility] Adult PT Treatment/Exercise - 11/04/19 0001      Knee/Hip Exercises: Stretches   Active Hamstring Stretch Left;3 reps;30 seconds    Active Hamstring Stretch Limitations seated    Passive Hamstring Stretch  Left;3 reps;30 seconds    Soleus Stretch Limitations slantboard 30 sec X 3    Other Knee/Hip Stretches seated heelslides 20 sec X 5 with self O.P      Knee/Hip Exercises: Machines for Strengthening   Cybex Knee Extension 10 lbs bilat 3X10    Cybex Knee Flexion 55 DL 3X10    Total Gym Leg Press 62 lbs SL press with slow eccentric lower and stretch at bottom 2X15, then bilat push 125 lbs 2X15      Manual Therapy   Manual therapy comments Lt knee PROM, patella mobs, flexion/extension mobs to tolerance                       PT Long Term Goals - 10/27/19 1000      PT LONG TERM GOAL #1   Title Pt will be independent in his HEP and progression.    Baseline initial program issued on 08/10/2019    Time 8    Period Weeks    Status Achieved      PT LONG TERM GOAL #2  Title Pt will be able to improve his L knee flexion to >/= 120 degrees.    Baseline 4-114 PROM    Time 8    Period Weeks    Status On-going      PT LONG TERM GOAL #3   Title Pt will be able to amb >/= 1000 feet with no assistive device with step through gait pattern.    Baseline now community ambulator, no AD    Time 8    Period Weeks    Status Achieved      PT LONG TERM GOAL #4   Title pt will be albe bend and pick up objects off the floor without difficulty.    Baseline now met    Time 8    Period Weeks    Status Achieved      PT LONG TERM GOAL #5   Title Pt will be able to report improved sleeping by greater than 50%.    Baseline now met    Time 8    Period Weeks    Status Achieved                 Plan - 11/04/19 0935    Clinical Impression Statement He still needs knee ROM but was able to progress resistance today for LE strengthening. Continue POC    Personal Factors and Comorbidities Comorbidity 1    Comorbidities OA, hyperthyroidism, L TKA on 07/19/2019    Examination-Activity Limitations Sleep;Transfers;Stand;Stairs;Squat    Stability/Clinical Decision Making Stable/Uncomplicated     Rehab Potential Excellent    PT Frequency 2x / week    PT Duration 8 weeks    PT Treatment/Interventions ADLs/Self Care Home Management;Cryotherapy;Electrical Stimulation;Gait training;Stair training;Functional mobility training;Neuromuscular re-education;Balance training;Therapeutic exercise;Therapeutic activities;Patient/family education;Manual techniques;Passive range of motion;Taping;Vasopneumatic Device    PT Next Visit Plan progress with strength and ROM as able    PT Home Exercise Plan [email or web address]    Consulted and Agree with Plan of Care Patient           Patient will benefit from skilled therapeutic intervention in order to improve the following deficits and impairments:  Pain, Postural dysfunction, Decreased strength, Decreased range of motion, Decreased activity tolerance, Abnormal gait, Difficulty walking, Increased edema  Visit Diagnosis: Acute pain of left knee  Muscle weakness (generalized)  Stiffness of left knee, not elsewhere classified  Localized edema  Difficulty in walking, not elsewhere classified     Problem List Patient Active Problem List   Diagnosis Date Noted  . 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 Primary osteoarthritis of left knee 07/18/2019  . Bilateral primary osteoarthritis of knee 06/18/2018  . Hyperthyroidism 02/01/2016    Debbe Odea, PT,DPT 11/04/2019, 9:39 AM  [facility] Physical Therapy [address] [place], [place], [address] Phone: [phone]   Fax:  [phone]  Name: Jordan Barajas MRN: [id number] Date of Birth: 1955-09-13

## 2019-11-10 ENCOUNTER — Ambulatory Visit (INDEPENDENT_AMBULATORY_CARE_PROVIDER_SITE_OTHER): Payer: BC Managed Care – PPO | Admitting: Physical Therapy

## 2019-11-10 ENCOUNTER — Other Ambulatory Visit: Payer: Self-pay

## 2019-11-10 DIAGNOSIS — M25662 Stiffness of left knee, not elsewhere classified: Secondary | ICD-10-CM | POA: Diagnosis not present

## 2019-11-10 DIAGNOSIS — R600 Localized edema: Secondary | ICD-10-CM

## 2019-11-10 DIAGNOSIS — M25562 Pain in left knee: Secondary | ICD-10-CM | POA: Diagnosis not present

## 2019-11-10 DIAGNOSIS — M6281 Muscle weakness (generalized): Secondary | ICD-10-CM | POA: Diagnosis not present

## 2019-11-10 NOTE — Therapy (Signed)
[facility] Physical Therapy [address] [place], [place], [address] Phone: [phone]   Fax:  [phone]  Physical Therapy Treatment/Recert/Progress note Progress Note reporting period 3/16/21to 11/10/19  See below for objective and subjective measurements relating to patients progress with PT.   Patient Details  Name: Jordan Barajas MRN: [id number] Date of Birth: November 09, 1955 Referring Provider (PT): Frankey Shown, MD   Encounter Date: 11/10/2019   PT End of Session - 11/10/19 0941    Visit Number 22    Number of Visits 30    Date for PT Re-Evaluation 12/08/19    Authorization - Visit Number 36    Authorization - Number of Visits 30    PT Start Time [phone]    PT Stop Time 0930    PT Time Calculation (min) 38 min    Activity Tolerance Patient tolerated treatment well    Behavior During Therapy [facility] for tasks assessed/performed           Past Medical History:  Diagnosis Date  . Arthritis   . Hyperthyroidism 02/01/2016    Past Surgical History:  Procedure Laterality Date  . APPENDECTOMY     as a child  . TOTAL KNEE ARTHROPLASTY Left 07/19/2019   Procedure: LEFT TOTAL KNEE ARTHROPLASTY;  Surgeon: Leandrew Koyanagi, MD;  Location: [place];  Service: Orthopedics;  Laterality: Left;    There were no vitals filed for this visit.   Subjective Assessment - 11/10/19 0916    Subjective my knee just feels really tight today    Pertinent History OA, L TKA 07/19/2019, hyperlipidemia    Limitations Standing;Walking    Patient Stated Goals Get back to work, walk without walker    Pain Onset More than a month ago              [facility] PT Assessment - 11/10/19 0001      Assessment   Medical Diagnosis L TKA 07/19/2019    Referring Provider (PT) Frankey Shown, MD      AROM   Left Knee Extension 2    Left Knee Flexion 110      Strength   Left Knee Flexion 4+/5    Left Knee Extension 4+/5               [facility] Adult PT Treatment/Exercise - 11/10/19 0001       Knee/Hip Exercises: Stretches   Active Hamstring Stretch Left;3 reps;30 seconds    Active Hamstring Stretch Limitations supine with strap    Other Knee/Hip Stretches supine heelslides with strap 10 sec X 10      Knee/Hip Exercises: Standing   Other Standing Knee Exercises TRX deep squats with 3 sec hold 2X10, TRX lunges X 10 bilat      Manual Therapy   Manual therapy comments Lt knee PROM,flexion/extension mobs to tolerance, STM/IASTM to H.S. in prone                  PT Education - 11/10/19 0941    Education Details encouraged more quad stretching and showed him how    Person(s) Educated Patient    Methods Explanation;Demonstration;Verbal cues    Comprehension Verbalized understanding;Returned demonstration               PT Long Term Goals - 11/10/19 0946      PT LONG TERM GOAL #1   Title Pt will be independent in his HEP and progression.    Baseline initial program issued on 08/10/2019    Time 8  Period Weeks    Status Achieved      PT LONG TERM GOAL #2   Title Pt will be able to improve his L knee flexion to >/= 120 degrees.    Baseline 2-110 PROM    Time 8    Period Weeks    Status On-going      PT LONG TERM GOAL #3   Title Pt will be able to amb >/= 1000 feet with no assistive device with step through gait pattern.    Baseline now community ambulator, no AD    Time 8    Period Weeks    Status Achieved      PT LONG TERM GOAL #4   Title pt will be albe bend and pick up objects off the floor without difficulty.    Baseline now met    Time 8    Period Weeks    Status Achieved      PT LONG TERM GOAL #5   Title Pt will be able to report improved sleeping by greater than 50%.    Baseline now met    Time 8    Period Weeks    Status Achieved                 Plan - 11/10/19 0944    Clinical Impression Statement Recert today as POC date was up. 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 He also has made progress with knee ROM but still lacks significant flexion ROM he is at 110 degreees PROM. PT recommending continuing PT for 4 more weeks to address these deficits and hopefully allow him to return to work safely.    Personal Factors and Comorbidities Comorbidity 1    Comorbidities OA, hyperthyroidism, L TKA on 07/19/2019    Examination-Activity Limitations Sleep;Transfers;Stand;Stairs;Squat    Stability/Clinical Decision Making Stable/Uncomplicated    Rehab Potential Excellent    PT Frequency 2x / week    PT Duration 8 weeks    PT Treatment/Interventions ADLs/Self Care Home Management;Cryotherapy;Electrical Stimulation;Gait training;Stair training;Functional mobility training;Neuromuscular re-education;Balance training;Therapeutic exercise;Therapeutic activities;Patient/family education;Manual techniques;Passive range of motion;Taping;Vasopneumatic Device    PT Next Visit Plan progress with strength and ROM as able    PT Home Exercise Plan [email or web address]    Consulted and Agree with Plan of Care Patient           Patient will benefit from skilled therapeutic intervention in order to improve the following deficits and impairments:  Pain, Postural dysfunction, Decreased strength, Decreased range of motion, Decreased activity tolerance, Abnormal gait, Difficulty walking, Increased edema  Visit Diagnosis: Acute pain of left knee  Muscle weakness (generalized)  Stiffness of left knee, not elsewhere classified  Localized edema     Problem List Patient Active Problem List   Diagnosis Date Noted  . 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 Primary osteoarthritis of left knee 07/18/2019  . Bilateral primary osteoarthritis of knee 06/18/2018  . Hyperthyroidism 02/01/2016    Debbe Odea, PT,DPT 11/10/2019, 9:49 AM  [facility] Physical Therapy [address] [place], [place], [address] Phone: [phone]   Fax:   [phone]  Name: Jamail Cullers MRN: [id number] Date of Birth: 11-17-1955

## 2019-11-11 ENCOUNTER — Telehealth: Payer: Self-pay | Admitting: Orthopaedic Surgery

## 2019-11-11 NOTE — Telephone Encounter (Signed)
ic pt, spoke with him about payment and auth he states was dropped off at least 2 weeks ago to go with a [place] form. He is aware [employer] or our office unable to locate any of these. I asked if he would have [employer] fax [place] and we would take of their request as soon as we get it and he said he would. I will watch for fax.

## 2019-11-12 ENCOUNTER — Other Ambulatory Visit: Payer: Self-pay

## 2019-11-12 ENCOUNTER — Ambulatory Visit (INDEPENDENT_AMBULATORY_CARE_PROVIDER_SITE_OTHER): Payer: BC Managed Care – PPO | Admitting: Physical Therapy

## 2019-11-12 DIAGNOSIS — R600 Localized edema: Secondary | ICD-10-CM | POA: Diagnosis not present

## 2019-11-12 DIAGNOSIS — M25662 Stiffness of left knee, not elsewhere classified: Secondary | ICD-10-CM

## 2019-11-12 DIAGNOSIS — M6281 Muscle weakness (generalized): Secondary | ICD-10-CM

## 2019-11-12 DIAGNOSIS — M25562 Pain in left knee: Secondary | ICD-10-CM | POA: Diagnosis not present

## 2019-11-12 DIAGNOSIS — R262 Difficulty in walking, not elsewhere classified: Secondary | ICD-10-CM

## 2019-11-12 NOTE — Therapy (Signed)
[place], [place], [address] Phone: [phone]   Fax:  [phone]  Physical Therapy Treatment  Patient Details  Name: Jordan Barajas MRN: [id number] Date of Birth: 04-Jul-1955 Referring Provider (PT): Frankey Shown, MD   Encounter Date: 11/12/2019   PT End of Session - 11/12/19 0922    Visit Number 23    Number of Visits 30    Date for PT Re-Evaluation 12/08/19    Authorization - Visit Number 23    Authorization - Number of Visits 30    PT Start Time 0849    PT Stop Time 0930    PT Time Calculation (min) 41 min    Activity Tolerance Patient tolerated treatment well    Behavior During Therapy [facility] for tasks assessed/performed           Past Medical History:  Diagnosis Date  . Arthritis   . Hyperthyroidism 02/01/2016    Past Surgical History:  Procedure Laterality Date  . APPENDECTOMY     as a child  . TOTAL KNEE ARTHROPLASTY Left 07/19/2019   Procedure: LEFT TOTAL KNEE ARTHROPLASTY;  Surgeon: Leandrew Koyanagi, MD;  Location: [place];  Service: Orthopedics;  Laterality: Left;    There were no vitals filed for this visit.   Subjective Assessment - 11/12/19 0908    Subjective no pain just tighntess in my knee. Only pain I have is in my other knee    Pertinent History OA, L TKA 07/19/2019, hyperlipidemia    Limitations Standing;Walking    Patient Stated Goals Get back to work, walk without walker    Currently in Pain? No/denies    Pain Onset More than a month ago             [facility] Adult PT Treatment/Exercise - 11/12/19 0001      Knee/Hip Exercises: Stretches   Active Hamstring Stretch Left;3 reps;30 seconds    Active Hamstring Stretch Limitations --   seated   Quad Stretch Left;3 reps;30 seconds    Quad Stretch Limitations prone with strap    Other Knee/Hip Stretches child pose for knee flexion stretching 15 sec X 10 reps      Knee/Hip Exercises: Aerobic   Nustep bike occupied so performed nu step warm up L  7 seat 10 X 5 min      Knee/Hip Exercises: Machines for Strengthening   Cybex Knee Extension 15 lbs bilat 2X15    Cybex Knee Flexion 55 DL 3X10    Total Gym Leg Press 100 lbs 2X10 then 62 lbs on Lt only 2X10      Manual Therapy   Manual therapy comments Lt knee PROM,flexion/extension mobs to tolerance in supine, then flexion mobs and PROM in sitting.                       PT Long Term Goals - 11/10/19 0946      PT LONG TERM GOAL #1   Title Pt will be independent in his HEP and progression.    Baseline initial program issued on 08/10/2019    Time 8    Period Weeks    Status Achieved      PT LONG TERM GOAL #2   Title Pt will be able to improve his L knee flexion to >/= 120 degrees.    Baseline 2-110 PROM    Time 8    Period Weeks    Status On-going      PT LONG  TERM GOAL #3   Title Pt will be able to amb >/= 1000 feet with no assistive device with step through gait pattern.    Baseline now community ambulator, no AD    Time 8    Period Weeks    Status Achieved      PT LONG TERM GOAL #4   Title pt will be albe bend and pick up objects off the floor without difficulty.    Baseline now met    Time 8    Period Weeks    Status Achieved      PT LONG TERM GOAL #5   Title Pt will be able to report improved sleeping by greater than 50%.    Baseline now met    Time 8    Period Weeks    Status Achieved                 Plan - 11/12/19 3735    Clinical Impression Statement Again focused on aggressive knee ROM and he tolerates this well. Then performed strengthening on resistance machines and he was able to increase his weight from last time showing improvents in his overall knee strength. Continue POC    Personal Factors and Comorbidities Comorbidity 1    Comorbidities OA, hyperthyroidism, L TKA on 07/19/2019    Examination-Activity Limitations Sleep;Transfers;Stand;Stairs;Squat    Stability/Clinical Decision Making Stable/Uncomplicated    Rehab Potential  Excellent    PT Frequency 2x / week    PT Duration 8 weeks    PT Treatment/Interventions ADLs/Self Care Home Management;Cryotherapy;Electrical Stimulation;Gait training;Stair training;Functional mobility training;Neuromuscular re-education;Balance training;Therapeutic exercise;Therapeutic activities;Patient/family education;Manual techniques;Passive range of motion;Taping;Vasopneumatic Device    PT Next Visit Plan progress with strength and ROM as able    PT Home Exercise Plan [email or web address]    Consulted and Agree with Plan of Care Patient           Patient will benefit from skilled therapeutic intervention in order to improve the following deficits and impairments:  Pain, Postural dysfunction, Decreased strength, Decreased range of motion, Decreased activity tolerance, Abnormal gait, Difficulty walking, Increased edema  Visit Diagnosis: Acute pain of left knee  Muscle weakness (generalized)  Stiffness of left knee, not elsewhere classified  Localized edema  Difficulty in walking, not elsewhere classified     Problem List Patient Active Problem List   Diagnosis Date Noted  . 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 Primary osteoarthritis of left knee 07/18/2019  . Bilateral primary osteoarthritis of knee 06/18/2018  . Hyperthyroidism 02/01/2016    Debbe Odea, PT,DPT 11/12/2019, 9:26 AM  [facility] Physical Therapy [address] [place], [place], [address] Phone: [phone]   Fax:  [phone]  Name: Jordan Barajas MRN: [id number] Date of Birth: 04-Jul-1955

## 2019-11-16 ENCOUNTER — Encounter: Payer: BC Managed Care – PPO | Admitting: Physical Therapy

## 2019-11-18 ENCOUNTER — Encounter: Payer: BC Managed Care – PPO | Admitting: Physical Therapy

## 2019-11-23 ENCOUNTER — Ambulatory Visit (INDEPENDENT_AMBULATORY_CARE_PROVIDER_SITE_OTHER): Payer: BC Managed Care – PPO | Admitting: Physical Therapy

## 2019-11-23 ENCOUNTER — Other Ambulatory Visit: Payer: Self-pay

## 2019-11-23 ENCOUNTER — Encounter: Payer: Self-pay | Admitting: Physical Therapy

## 2019-11-23 DIAGNOSIS — R600 Localized edema: Secondary | ICD-10-CM | POA: Diagnosis not present

## 2019-11-23 DIAGNOSIS — M25662 Stiffness of left knee, not elsewhere classified: Secondary | ICD-10-CM | POA: Diagnosis not present

## 2019-11-23 DIAGNOSIS — R262 Difficulty in walking, not elsewhere classified: Secondary | ICD-10-CM

## 2019-11-23 DIAGNOSIS — M6281 Muscle weakness (generalized): Secondary | ICD-10-CM | POA: Diagnosis not present

## 2019-11-23 DIAGNOSIS — M25562 Pain in left knee: Secondary | ICD-10-CM

## 2019-11-23 NOTE — Therapy (Signed)
[facility] Physical Therapy [address] [place], [place], [address] Phone: [phone]   Fax:  [phone]  Physical Therapy Treatment/Discharge PHYSICAL THERAPY DISCHARGE SUMMARY  Visits from Start of Care: 24  Current functional level related to goals / functional outcomes: See below   Remaining deficits: See below   Education / Equipment: HEP  Plan: Patient agrees to discharge.  Patient goals were partially met. Patient is being discharged due to                                                     ?????   Discharged to not having any more authorized visits, but he is at a good functional level.    Patient Details  Name: Stepen Prins MRN: [id number] Date of Birth: Jul 05, 1955 Referring Provider (PT): Frankey Shown, MD   Encounter Date: 11/23/2019   PT End of Session - 11/23/19 0838    Visit Number 24    Number of Visits 30    Date for PT Re-Evaluation 12/08/19    Authorization - Visit Number 73   Authorization - Number of Visits 30 (had HHPT prior)   PT Start Time 0807    PT Stop Time 0850    PT Time Calculation (min) 43 min    Activity Tolerance Patient tolerated treatment well    Behavior During Therapy [facility] for tasks assessed/performed           Past Medical History:  Diagnosis Date  . Arthritis   . Hyperthyroidism 02/01/2016    Past Surgical History:  Procedure Laterality Date  . APPENDECTOMY     as a child  . TOTAL KNEE ARTHROPLASTY Left 07/19/2019   Procedure: LEFT TOTAL KNEE ARTHROPLASTY;  Surgeon: Leandrew Koyanagi, MD;  Location: [place];  Service: Orthopedics;  Laterality: Left;    There were no vitals filed for this visit.   Subjective Assessment - 11/23/19 0837    Subjective still having some soreness and stiffness in the back of my Left knee    Pertinent History OA, L TKA 07/19/2019, hyperlipidemia    Limitations Standing;Walking    Patient Stated Goals Get back to work, walk without walker    Pain Onset More than a month ago               [facility] PT Assessment - 11/23/19 0001      Assessment   Medical Diagnosis L TKA 07/19/2019    Referring Provider (PT) Frankey Shown, MD      AROM   Left Knee Flexion 111   AAROM with strap     Strength   Left Knee Flexion 5/5    Left Knee Extension 5/5                         [facility] Adult PT Treatment/Exercise - 11/23/19 0001      Knee/Hip Exercises: Stretches   Active Hamstring Stretch Left;3 reps;30 seconds    Other Knee/Hip Stretches supine heelslides 10 sec X 10  times      Knee/Hip Exercises: Aerobic   Recumbent Bike 9 min L2      Knee/Hip Exercises: Machines for Strengthening   Cybex Knee Extension 20 lbs 3X10    Cybex Knee Flexion 55 DL 3X10    Total Gym Leg Press 106 lbs 2X10 then  68 lbs on Lt only 2X10      Manual Therapy   Manual therapy comments Lt knee PROM,flexion/extension mobs to tolerance in supine, then flexion mobs and PROM in sitting.                       PT Long Term Goals - 11/23/19 0857      PT LONG TERM GOAL #1   Title Pt will be independent in his HEP and progression.    Time 8    Period Weeks    Status Achieved      PT LONG TERM GOAL #2   Title Pt will be able to improve his L knee flexion to >/= 120 degrees.    Baseline 2-110 PROM    Time 8    Period Weeks    Status Partially Met      PT LONG TERM GOAL #3   Title Pt will be able to amb >/= 1000 feet with no assistive device with step through gait pattern.    Baseline now community ambulator, no AD    Time 8    Period Weeks    Status Achieved      PT LONG TERM GOAL #4   Title pt will be albe bend and pick up objects off the floor without difficulty.    Baseline now met    Time 8    Period Weeks    Status Achieved      PT LONG TERM GOAL #5   Title Pt will be able to report improved sleeping by greater than 50%.    Baseline now met    Time 8    Period Weeks    Status Achieved                 Plan - 11/23/19 0858    Clinical  Impression Statement He has now met all 30 of his authorized PT visits through his insurance (he had 6 HHPT visits and today is visit 24 for outpaitient PT). He has overall done well with PT and has good strength and functional level. He has met all of PT goals except ROM which he still has mild ROM deficits. He was encouraged to continue with HEP and stretching more at home. He had no further questions or concerns about discharge today due to being out of visits.    Personal Factors and Comorbidities Comorbidity 1    Comorbidities OA, hyperthyroidism, L TKA on 07/19/2019    Examination-Activity Limitations Sleep;Transfers;Stand;Stairs;Squat    Stability/Clinical Decision Making Stable/Uncomplicated    Rehab Potential Excellent    PT Frequency 2x / week    PT Duration 8 weeks    PT Treatment/Interventions ADLs/Self Care Home Management;Cryotherapy;Electrical Stimulation;Gait training;Stair training;Functional mobility training;Neuromuscular re-education;Balance training;Therapeutic exercise;Therapeutic activities;Patient/family education;Manual techniques;Passive range of motion;Taping;Vasopneumatic Device    PT Next Visit Plan discharge today    PT Home Exercise Plan [id number]    Consulted and Agree with Plan of Care Patient           Patient will benefit from skilled therapeutic intervention in order to improve the following deficits and impairments:  Pain, Postural dysfunction, Decreased strength, Decreased range of motion, Decreased activity tolerance, Abnormal gait, Difficulty walking, Increased edema  Visit Diagnosis: Acute pain of left knee  Muscle weakness (generalized)  Stiffness of left knee, not elsewhere classified  Localized edema  Difficulty in walking, not elsewhere classified     Problem List Patient Active Problem List  Diagnosis Date Noted  . 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 Primary osteoarthritis of left knee 07/18/2019  . Bilateral primary  osteoarthritis of knee 06/18/2018  . Hyperthyroidism 02/01/2016    Debbe Odea, PT,DPT 11/23/2019, 9:01 AM  [facility] Physical Therapy [address] [place], [place], [address] Phone: [phone]   Fax:  [phone]  Name: Gagan Dillion MRN: [id number] Date of Birth: 01-Jul-1955

## 2019-11-25 ENCOUNTER — Encounter: Payer: BC Managed Care – PPO | Admitting: Physical Therapy

## 2019-12-13 ENCOUNTER — Telehealth: Payer: Self-pay | Admitting: Orthopaedic Surgery

## 2019-12-13 NOTE — Telephone Encounter (Signed)
Note made.  

## 2019-12-13 NOTE — Telephone Encounter (Signed)
Okay to write note.

## 2019-12-13 NOTE — Telephone Encounter (Signed)
Should be fine to do

## 2019-12-13 NOTE — Telephone Encounter (Signed)
Patient stopped by.   He is requesting that he be given a note that returns him to work on Monday 12/20/2019  Call back: [phone]

## 2019-12-14 ENCOUNTER — Telehealth: Payer: Self-pay | Admitting: Orthopaedic Surgery

## 2019-12-14 NOTE — Telephone Encounter (Signed)
Pt dropped off [employer] Forms. Sent to [employer].

## 2019-12-16 ENCOUNTER — Telehealth: Payer: Self-pay

## 2019-12-16 ENCOUNTER — Ambulatory Visit: Payer: Self-pay

## 2019-12-16 ENCOUNTER — Encounter: Payer: Self-pay | Admitting: Orthopaedic Surgery

## 2019-12-16 ENCOUNTER — Ambulatory Visit: Payer: BC Managed Care – PPO | Admitting: Orthopaedic Surgery

## 2019-12-16 DIAGNOSIS — Z96652 Presence of left artificial knee joint: Secondary | ICD-10-CM

## 2019-12-16 DIAGNOSIS — M1711 Unilateral primary osteoarthritis, right knee: Secondary | ICD-10-CM | POA: Insufficient documentation

## 2019-12-16 NOTE — Telephone Encounter (Signed)
Noted  

## 2019-12-16 NOTE — Telephone Encounter (Signed)
Please get approval for right knee gel injection-Dr. Roda Shutters pt

## 2019-12-19 NOTE — Progress Notes (Signed)
   Office Visit Note   Patient: Jordan Barajas           Date of Birth: 1955/06/11           MRN: [id number] Visit Date: 12/16/2019              Requested by: Georgann Housekeeper, MD [address] [place],  [place] [address] PCP: Georgann Housekeeper, MD   Assessment & Plan: Visit Diagnoses:  1. Status post total left knee replacement   2. Primary osteoarthritis of right knee     Plan: Impression is stable left total knee replacement doing well and right knee DJD.  After discussion of his options for his right knee he would like to get another Visco injection.  We will get the approval for this.  In terms of the left knee he has done well.  We updated his handicap placard.  We will see him back for the Visco injection.  Follow-Up Instructions: Return in about 7 months (around 07/18/2020).   Orders:  Orders Placed This Encounter  Procedures  . XR Knee 1-2 Views Left   No orders of the defined types were placed in this encounter.     Procedures: No procedures performed   Clinical Data: No additional findings.   Subjective: Chief Complaint  Patient presents with  . Left Knee - Pain    Mr. Cessna is 48-month status post left total knee replacement and doing very well.  Not taking anything for pain.  He is also following up for right knee arthritis and would like to have a cortisone injection.  We injected his right knee with Visco 7 months ago.   Review of Systems   Objective: Vital Signs: There were no vitals taken for this visit.  Physical Exam  Ortho Exam Left knee shows a fully healed surgical scar with excellent range of motion without pain.  No effusion. Right knee shows no effusion.  Painful range of motion.  Close cruciates are stable. Specialty Comments:  No specialty comments available.  Imaging: No results found.   PMFS History: Patient Active Problem List   Diagnosis Date Noted  . Primary osteoarthritis of right knee 12/16/2019  . Status post  total left knee replacement 07/19/2019  . Primary osteoarthritis of left knee 07/18/2019  . Bilateral primary osteoarthritis of knee 06/18/2018  . Hyperthyroidism 02/01/2016   Past Medical History:  Diagnosis Date  . Arthritis   . Hyperthyroidism 02/01/2016    Family History  Problem Relation Age of Onset  . Thyroid disease Neg Hx     Past Surgical History:  Procedure Laterality Date  . APPENDECTOMY     as a child  . TOTAL KNEE ARTHROPLASTY Left 07/19/2019   Procedure: LEFT TOTAL KNEE ARTHROPLASTY;  Surgeon: Tarry Kos, MD;  Location: [facility];  Service: Orthopedics;  Laterality: Left;   Social History   Occupational History  . Not on file  Tobacco Use  . Smoking status: Never Smoker  . Smokeless tobacco: Never Used  Vaping Use  . Vaping Use: Never used  Substance and Sexual Activity  . Alcohol use: No  . Drug use: No  . Sexual activity: Not on file

## 2019-12-24 ENCOUNTER — Telehealth: Payer: Self-pay

## 2019-12-24 NOTE — Telephone Encounter (Signed)
Submitted VOB, SynviscOne, right knee. 

## 2019-12-27 ENCOUNTER — Telehealth: Payer: Self-pay

## 2019-12-27 NOTE — Telephone Encounter (Signed)
Pt's insurance requires prior authorization for gel injection. This was completed and faxed to insurance company. Waiting on approval or denial letter

## 2020-01-04 ENCOUNTER — Telehealth: Payer: Self-pay

## 2020-01-04 NOTE — Telephone Encounter (Signed)
Called and schedule pt for appointment. 

## 2020-01-04 NOTE — Telephone Encounter (Signed)
Approved for Synvisc one-Right knee Dr. Tressie Stalker and Bill Covered @ 100% Prior auth required Auth # [id number] Dates:12/27/19-06/27/20   Ok to schedule at next available

## 2020-01-18 ENCOUNTER — Ambulatory Visit (INDEPENDENT_AMBULATORY_CARE_PROVIDER_SITE_OTHER): Payer: BC Managed Care – PPO | Admitting: Orthopaedic Surgery

## 2020-01-18 ENCOUNTER — Encounter: Payer: Self-pay | Admitting: Orthopaedic Surgery

## 2020-01-18 DIAGNOSIS — M1711 Unilateral primary osteoarthritis, right knee: Secondary | ICD-10-CM | POA: Diagnosis not present

## 2020-01-18 MED ORDER — HYLAN G-F 20 48 MG/6ML IX SOSY
48.0000 mg | PREFILLED_SYRINGE | INTRA_ARTICULAR | Status: AC | PRN
  Administered 2020-01-18: 48 mg via INTRA_ARTICULAR

## 2020-01-18 NOTE — Progress Notes (Signed)
   Procedure Note  Patient: Jordan Barajas             Date of Birth: 1956-05-26           MRN: [id number]             Visit Date: 01/18/2020  Procedures: Visit Diagnoses:  1. Primary osteoarthritis of right knee     Large Joint Inj: R knee on 01/18/2020 10:48 AM Indications: pain Details: 22 G needle  Arthrogram: No  Medications: 48 mg Hylan 48 MG/6ML Outcome: tolerated well, no immediate complications Patient was prepped and draped in the usual sterile fashion.

## 2020-05-01 DIAGNOSIS — R972 Elevated prostate specific antigen [PSA]: Secondary | ICD-10-CM | POA: Diagnosis not present

## 2020-05-01 DIAGNOSIS — Z23 Encounter for immunization: Secondary | ICD-10-CM | POA: Diagnosis not present

## 2020-05-01 DIAGNOSIS — E039 Hypothyroidism, unspecified: Secondary | ICD-10-CM | POA: Diagnosis not present

## 2020-05-01 DIAGNOSIS — I10 Essential (primary) hypertension: Secondary | ICD-10-CM | POA: Diagnosis not present

## 2020-05-01 DIAGNOSIS — E7800 Pure hypercholesterolemia, unspecified: Secondary | ICD-10-CM | POA: Diagnosis not present

## 2020-07-03 DIAGNOSIS — E0590 Thyrotoxicosis, unspecified without thyrotoxic crisis or storm: Secondary | ICD-10-CM | POA: Diagnosis not present

## 2020-07-20 ENCOUNTER — Other Ambulatory Visit: Payer: Self-pay

## 2020-07-20 ENCOUNTER — Encounter: Payer: Self-pay | Admitting: Orthopaedic Surgery

## 2020-07-20 ENCOUNTER — Telehealth: Payer: Self-pay

## 2020-07-20 ENCOUNTER — Ambulatory Visit: Payer: Self-pay

## 2020-07-20 ENCOUNTER — Ambulatory Visit (INDEPENDENT_AMBULATORY_CARE_PROVIDER_SITE_OTHER): Payer: BC Managed Care – PPO | Admitting: Orthopaedic Surgery

## 2020-07-20 VITALS — Ht 69.0 in | Wt 210.0 lb

## 2020-07-20 DIAGNOSIS — Z96652 Presence of left artificial knee joint: Secondary | ICD-10-CM

## 2020-07-20 DIAGNOSIS — M1711 Unilateral primary osteoarthritis, right knee: Secondary | ICD-10-CM

## 2020-07-20 DIAGNOSIS — M722 Plantar fascial fibromatosis: Secondary | ICD-10-CM | POA: Diagnosis not present

## 2020-07-20 NOTE — Progress Notes (Signed)
Office Visit Note   Patient: Jordan Barajas           Date of Birth: 07-31-1955           MRN: [id number] Visit Date: 07/20/2020              Requested by: Georgann Housekeeper, MD [address] [place],  [place] [address] PCP: Georgann Housekeeper, MD   Assessment & Plan: Visit Diagnoses:  1. Primary osteoarthritis of right knee   2. Status post total left knee replacement   3. Plantar fascial fibromatosis     Plan: Mr. Spang is doing well 1 year status post left total knee replacement.  We will see him back at 1 year intervals.  Dental prophylaxis reinforced.  For the right knee he has noticed significant benefit from Synvisc injection.  He would like to get this repeated.  For the plantar fibroma we will provide him with a doughnut.  Follow-Up Instructions: Return in about 1 year (around 07/20/2021).   Orders:  Orders Placed This Encounter  Procedures  . XR Knee 1-2 Views Right  . XR Knee 1-2 Views Left   No orders of the defined types were placed in this encounter.     Procedures: No procedures performed   Clinical Data: No additional findings.   Subjective: Chief Complaint  Patient presents with  . Right Knee - Follow-up  . Left Knee - Follow-up    Left total knee arthroplasty 07/19/2019    Mr. Baskette comes in today for follow-up of 1 year status post left uncemented total knee replacement, right knee DJD status post Synvisc injection 6 months ago which has really helped with the pain and decrease his requirement for pain medication,, left foot mass.  Left foot mass causes occasional discomfort with walking.   Review of Systems   Objective: Vital Signs: Ht 5\' 9"  (1.753 m)   Wt 210 lb (95.3 kg)   BMI 31.01 kg/m   Physical Exam  Ortho Exam Left knee shows fully healed surgical scar.  No swelling.  Range of motion 0 to 110 degrees Right knee shows small effusion.  Slight varus deformity.  Crepitus with range of motion. Left foot shows plantar  fibromatosis in the arch. Specialty Comments:  No specialty comments available.  Imaging: XR Knee 1-2 Views Left  Result Date: 07/20/2020 Stable total knee replacement in good alignment.   XR Knee 1-2 Views Right  Result Date: 07/20/2020 Advanced tricompartmental DJD with varus deformity    PMFS History: Patient Active Problem List   Diagnosis Date Noted  . Primary osteoarthritis of right knee 12/16/2019  . 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 Primary osteoarthritis of left knee 07/18/2019  . Bilateral primary osteoarthritis of knee 06/18/2018  . Hyperthyroidism 02/01/2016   Past Medical History:  Diagnosis Date  . Arthritis   . Hyperthyroidism 02/01/2016    Family History  Problem Relation Age of Onset  . Thyroid disease Neg Hx     Past Surgical History:  Procedure Laterality Date  . APPENDECTOMY     as a child  . TOTAL KNEE ARTHROPLASTY Left 07/19/2019   Procedure: LEFT TOTAL KNEE ARTHROPLASTY;  Surgeon: 07/21/2019, MD;  Location: [facility];  Service: Orthopedics;  Laterality: Left;   Social History   Occupational History  . Not on file  Tobacco Use  . Smoking status: Never Smoker  . Smokeless tobacco: Never Used  Vaping Use  . Vaping Use: Never  used  Substance and Sexual Activity  . Alcohol use: No  . Drug use: No  . Sexual activity: Not on file

## 2020-07-20 NOTE — Telephone Encounter (Signed)
Please submit for right knee gel inj 

## 2020-07-20 NOTE — Telephone Encounter (Signed)
Noted  

## 2020-07-27 ENCOUNTER — Telehealth: Payer: Self-pay

## 2020-07-27 NOTE — Telephone Encounter (Signed)
Submitted VOB for SynviscOne, right knee. Pending BV. 

## 2020-09-04 ENCOUNTER — Telehealth: Payer: Self-pay

## 2020-09-04 NOTE — Telephone Encounter (Signed)
Currently still waiting on VOB for SynviscOne, right knee.  Called and left a VM for case manager Whitney to return my call since this has been submitted since the beginning of March, 2022 and no benefits have been uploaded on the portal.

## 2020-09-05 DIAGNOSIS — E0590 Thyrotoxicosis, unspecified without thyrotoxic crisis or storm: Secondary | ICD-10-CM | POA: Diagnosis not present

## 2020-09-14 ENCOUNTER — Telehealth: Payer: Self-pay

## 2020-09-14 NOTE — Telephone Encounter (Signed)
Currently still waiting on VOB for SynviscOne, right knee.   Left a VM with John who is filling in for [place] while she is out.

## 2020-09-19 ENCOUNTER — Telehealth: Payer: Self-pay

## 2020-09-19 NOTE — Telephone Encounter (Signed)
PA required SynviscOne, right knee Submitted PA online through [employer] PA Pending

## 2020-09-20 ENCOUNTER — Telehealth: Payer: Self-pay

## 2020-09-20 NOTE — Telephone Encounter (Signed)
Approved for SynviscOne, right knee. Buy & Bill Covered at 100% of the allowed amount Co-pay of $50.00 PA Approval# [id number] Valid 09/19/2020- 03/17/2021  Appt. 09/26/2020 with Dr. Roda Shutters

## 2020-09-26 ENCOUNTER — Other Ambulatory Visit: Payer: Self-pay

## 2020-09-26 ENCOUNTER — Ambulatory Visit: Payer: BC Managed Care – PPO | Admitting: Orthopaedic Surgery

## 2020-09-26 ENCOUNTER — Encounter: Payer: Self-pay | Admitting: Orthopaedic Surgery

## 2020-09-26 DIAGNOSIS — M1711 Unilateral primary osteoarthritis, right knee: Secondary | ICD-10-CM | POA: Diagnosis not present

## 2020-09-26 MED ORDER — LIDOCAINE HCL 1 % IJ SOLN
2.0000 mL | INTRAMUSCULAR | Status: AC | PRN
  Administered 2020-09-26: 2 mL

## 2020-09-26 MED ORDER — BUPIVACAINE HCL 0.25 % IJ SOLN
2.0000 mL | INTRAMUSCULAR | Status: AC | PRN
  Administered 2020-09-26: 2 mL via INTRA_ARTICULAR

## 2020-09-26 MED ORDER — HYLAN G-F 20 48 MG/6ML IX SOSY
48.0000 mg | PREFILLED_SYRINGE | INTRA_ARTICULAR | Status: AC | PRN
  Administered 2020-09-26: 48 mg via INTRA_ARTICULAR

## 2020-09-26 NOTE — Progress Notes (Signed)
   Office Visit Note   Patient: Jordan Barajas           Date of Birth: 06/16/1955           MRN: [id number] Visit Date: 09/26/2020              Requested by: Georgann Housekeeper, MD [address] [place],  [place] [address] PCP: Georgann Housekeeper, MD   Assessment & Plan: Visit Diagnoses:  1. Unilateral primary osteoarthritis, right knee     Plan: Impression is advanced degenerative joint disease right knee.  We have injected the right knee with Synvisc 1 today which she tolerated well.  He is interested in scheduling total knee arthroplasty in the fall and will follow up with [place] 1 to 2 months prior to his desired time.  Call with concerns or questions in the meantime.  Follow-Up Instructions: Return if symptoms worsen or fail to improve.   Orders:  Orders Placed This Encounter  Procedures  . Large Joint Inj: R knee   No orders of the defined types were placed in this encounter.     Procedures: Large Joint Inj: R knee on 09/26/2020 2:04 PM Indications: pain Details: 22 G needle, anterolateral approach Medications: 2 mL lidocaine 1 %; 2 mL bupivacaine 0.25 %; 48 mg Hylan 48 MG/6ML      Clinical Data: No additional findings.   Subjective: No chief complaint on file.   HPI patient is a pleasant 65 year old gentleman who comes in today for his first viscosupplementation injection to the right knee.  He has an underlying history of osteoarthritis and has not had long-lasting relief from cortisone injections in the past.     Objective: Vital Signs: There were no vitals taken for this visit.    Ortho Exam stable right knee exam  Specialty Comments:  No specialty comments available.  Imaging: No new imaging   PMFS History: Patient Active Problem List   Diagnosis Date Noted  . Primary osteoarthritis of right knee 12/16/2019  . 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 Primary osteoarthritis of left knee 07/18/2019  . Bilateral primary  osteoarthritis of knee 06/18/2018  . Hyperthyroidism 02/01/2016   Past Medical History:  Diagnosis Date  . Arthritis   . Hyperthyroidism 02/01/2016    Family History  Problem Relation Age of Onset  . Thyroid disease Neg Hx     Past Surgical History:  Procedure Laterality Date  . APPENDECTOMY     as a child  . TOTAL KNEE ARTHROPLASTY Left 07/19/2019   Procedure: LEFT TOTAL KNEE ARTHROPLASTY;  Surgeon: Tarry Kos, MD;  Location: [facility];  Service: Orthopedics;  Laterality: Left;   Social History   Occupational History  . Not on file  Tobacco Use  . Smoking status: Never Smoker  . Smokeless tobacco: Never Used  Vaping Use  . Vaping Use: Never used  Substance and Sexual Activity  . Alcohol use: No  . Drug use: No  . Sexual activity: Not on file

## 2020-10-19 IMAGING — DX DG KNEE 1-2V PORT*L*
2 series · 2 of 2 positions shown · non-contrast
Comparison: Plain films left knee 11 [DATE].

CLINICAL DATA: Patient status post left knee replacement today.

EXAM:
PORTABLE LEFT KNEE - 1-2 VIEW

[knee ap]
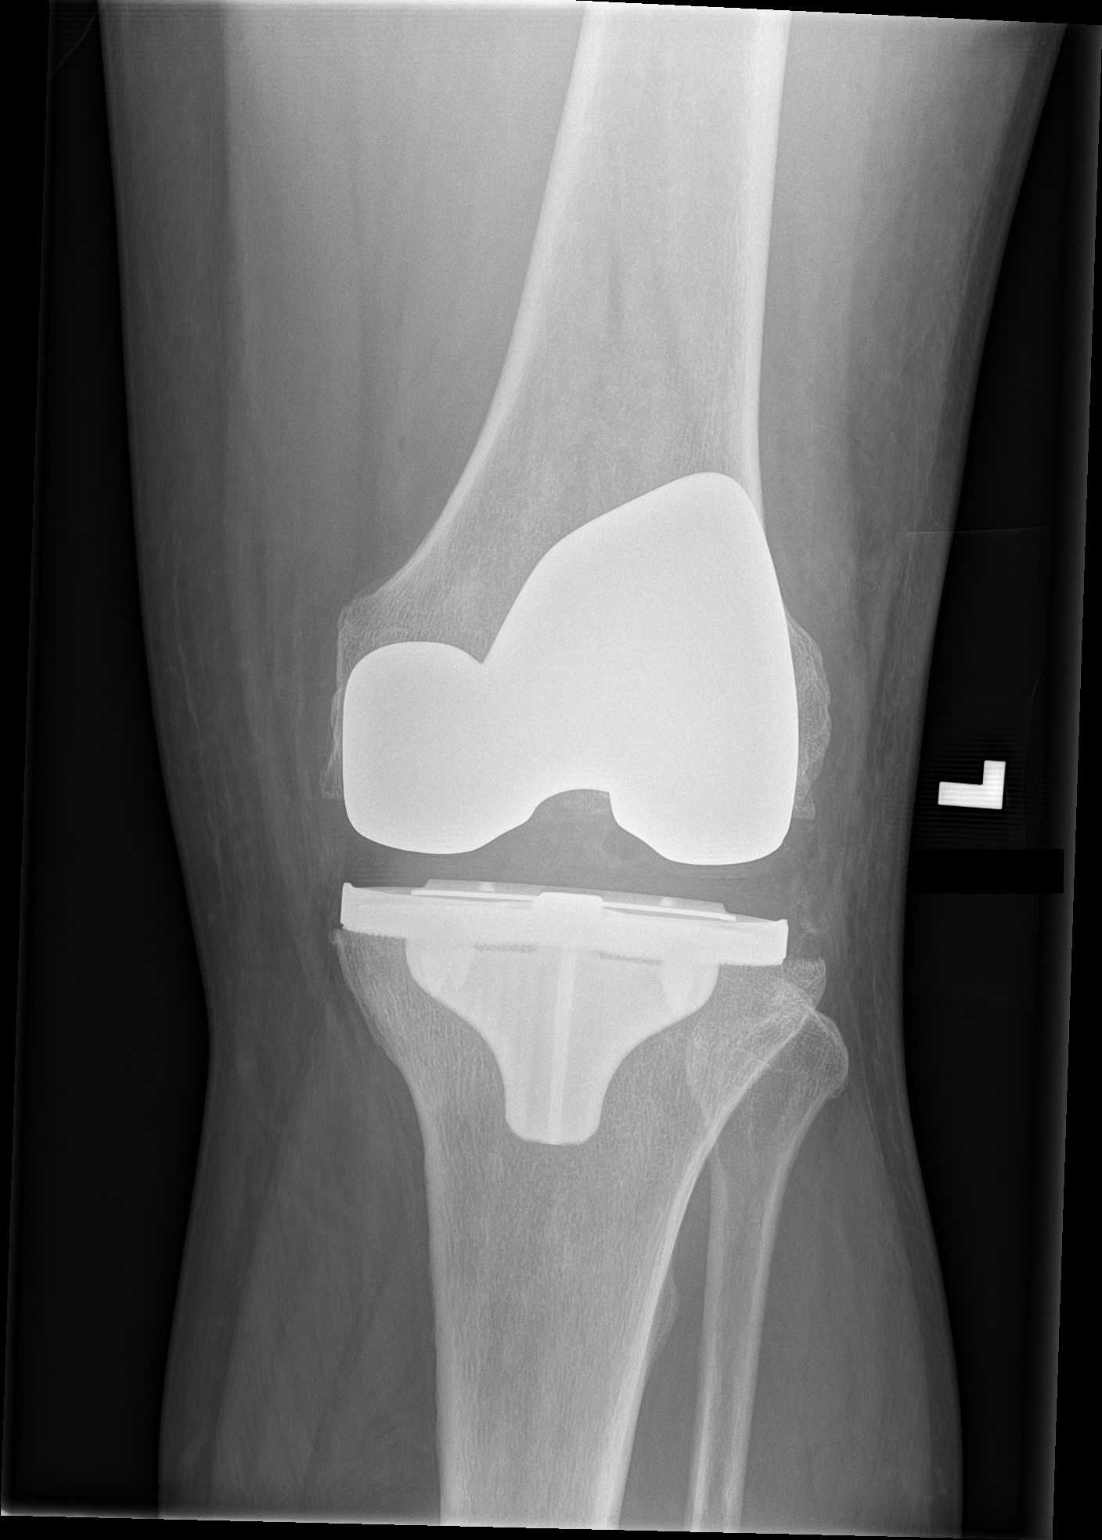

[knee lat]
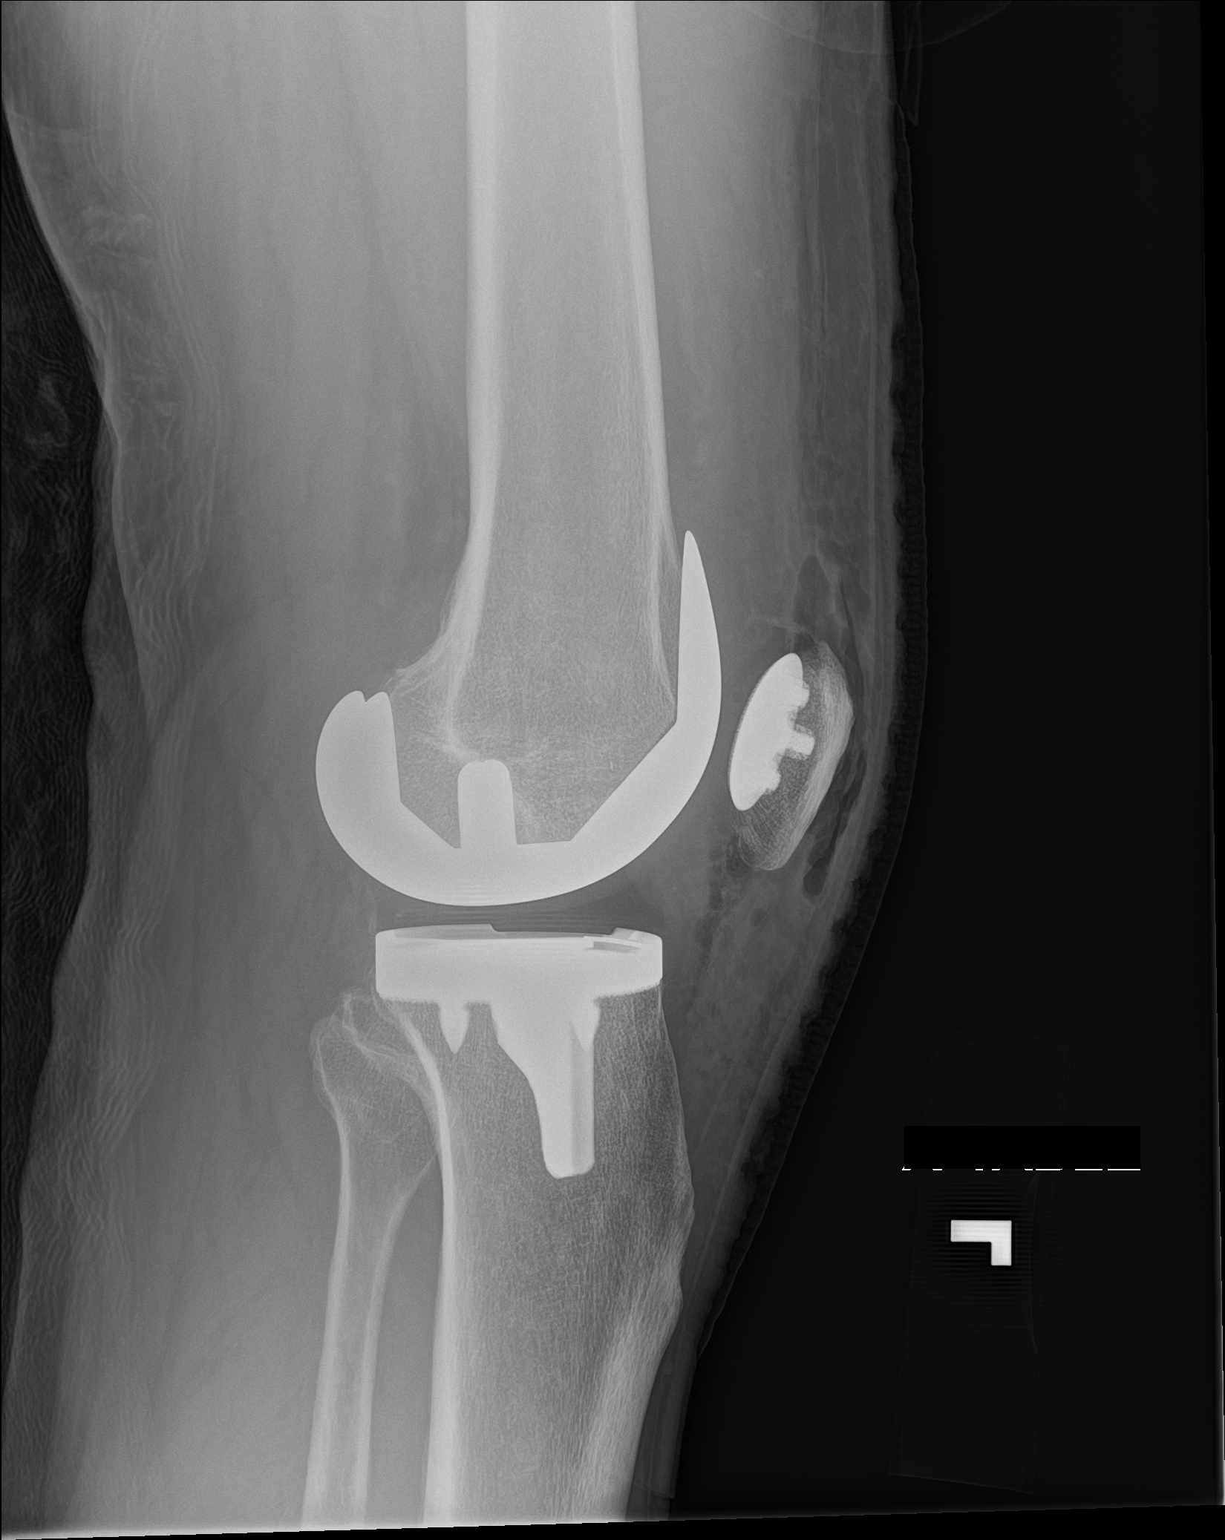

[2 of 2 positions shown; findings below may reference images not displayed]

FINDINGS: A new total arthroplasty is in place. The device is located. No
fracture or other acute abnormality. Gas in the soft tissues from
surgery noted.
IMPRESSION: Status post left knee replacement.  No acute finding.

## 2020-10-30 DIAGNOSIS — Z1389 Encounter for screening for other disorder: Secondary | ICD-10-CM | POA: Diagnosis not present

## 2020-10-30 DIAGNOSIS — I10 Essential (primary) hypertension: Secondary | ICD-10-CM | POA: Diagnosis not present

## 2020-10-30 DIAGNOSIS — R972 Elevated prostate specific antigen [PSA]: Secondary | ICD-10-CM | POA: Diagnosis not present

## 2020-10-30 DIAGNOSIS — E039 Hypothyroidism, unspecified: Secondary | ICD-10-CM | POA: Diagnosis not present

## 2021-03-09 DIAGNOSIS — Z23 Encounter for immunization: Secondary | ICD-10-CM | POA: Diagnosis not present

## 2021-04-12 DIAGNOSIS — Z1211 Encounter for screening for malignant neoplasm of colon: Secondary | ICD-10-CM | POA: Diagnosis not present

## 2021-04-18 DIAGNOSIS — H52223 Regular astigmatism, bilateral: Secondary | ICD-10-CM | POA: Diagnosis not present

## 2021-04-18 DIAGNOSIS — H2513 Age-related nuclear cataract, bilateral: Secondary | ICD-10-CM | POA: Diagnosis not present

## 2021-04-18 DIAGNOSIS — H5203 Hypermetropia, bilateral: Secondary | ICD-10-CM | POA: Diagnosis not present

## 2021-04-18 DIAGNOSIS — H524 Presbyopia: Secondary | ICD-10-CM | POA: Diagnosis not present

## 2021-04-26 DIAGNOSIS — Z1211 Encounter for screening for malignant neoplasm of colon: Secondary | ICD-10-CM | POA: Diagnosis not present

## 2021-04-26 DIAGNOSIS — K635 Polyp of colon: Secondary | ICD-10-CM | POA: Diagnosis not present

## 2021-05-01 DIAGNOSIS — K635 Polyp of colon: Secondary | ICD-10-CM | POA: Diagnosis not present

## 2021-05-02 DIAGNOSIS — E0500 Thyrotoxicosis with diffuse goiter without thyrotoxic crisis or storm: Secondary | ICD-10-CM | POA: Diagnosis not present

## 2021-05-31 DIAGNOSIS — D126 Benign neoplasm of colon, unspecified: Secondary | ICD-10-CM | POA: Diagnosis not present

## 2021-07-05 ENCOUNTER — Other Ambulatory Visit: Payer: Self-pay | Admitting: Ophthalmology

## 2021-07-05 DIAGNOSIS — E0500 Thyrotoxicosis with diffuse goiter without thyrotoxic crisis or storm: Secondary | ICD-10-CM | POA: Diagnosis not present

## 2021-07-05 DIAGNOSIS — H02534 Eyelid retraction left upper eyelid: Secondary | ICD-10-CM | POA: Diagnosis not present

## 2021-07-05 DIAGNOSIS — H05243 Constant exophthalmos, bilateral: Secondary | ICD-10-CM | POA: Diagnosis not present

## 2021-07-05 DIAGNOSIS — H02532 Eyelid retraction right lower eyelid: Secondary | ICD-10-CM | POA: Diagnosis not present

## 2021-07-05 DIAGNOSIS — H02535 Eyelid retraction left lower eyelid: Secondary | ICD-10-CM | POA: Diagnosis not present

## 2021-08-06 ENCOUNTER — Ambulatory Visit
Admission: RE | Admit: 2021-08-06 | Discharge: 2021-08-06 | Disposition: A | Discharge status: Home / Self Care | Payer: BC Managed Care – PPO | Source: Ambulatory Visit | Attending: Ophthalmology | Admitting: Ophthalmology

## 2021-08-06 ENCOUNTER — Other Ambulatory Visit: Payer: Self-pay

## 2021-08-06 DIAGNOSIS — H0520 Unspecified exophthalmos: Secondary | ICD-10-CM | POA: Diagnosis not present

## 2021-08-06 DIAGNOSIS — H05243 Constant exophthalmos, bilateral: Secondary | ICD-10-CM

## 2021-08-23 DIAGNOSIS — H02535 Eyelid retraction left lower eyelid: Secondary | ICD-10-CM | POA: Diagnosis not present

## 2021-08-23 DIAGNOSIS — H02534 Eyelid retraction left upper eyelid: Secondary | ICD-10-CM | POA: Diagnosis not present

## 2021-08-23 DIAGNOSIS — E0500 Thyrotoxicosis with diffuse goiter without thyrotoxic crisis or storm: Secondary | ICD-10-CM | POA: Diagnosis not present

## 2021-08-23 DIAGNOSIS — H02532 Eyelid retraction right lower eyelid: Secondary | ICD-10-CM | POA: Diagnosis not present

## 2022-03-08 DIAGNOSIS — Z23 Encounter for immunization: Secondary | ICD-10-CM | POA: Diagnosis not present

## 2022-07-18 DIAGNOSIS — Z111 Encounter for screening for respiratory tuberculosis: Secondary | ICD-10-CM | POA: Diagnosis not present

## 2022-07-18 DIAGNOSIS — E039 Hypothyroidism, unspecified: Secondary | ICD-10-CM | POA: Diagnosis not present

## 2022-07-18 DIAGNOSIS — I10 Essential (primary) hypertension: Secondary | ICD-10-CM | POA: Diagnosis not present

## 2022-07-24 DIAGNOSIS — H5203 Hypermetropia, bilateral: Secondary | ICD-10-CM | POA: Diagnosis not present

## 2022-07-24 DIAGNOSIS — E119 Type 2 diabetes mellitus without complications: Secondary | ICD-10-CM | POA: Diagnosis not present

## 2022-07-24 DIAGNOSIS — H0520 Unspecified exophthalmos: Secondary | ICD-10-CM | POA: Diagnosis not present

## 2022-07-24 DIAGNOSIS — H524 Presbyopia: Secondary | ICD-10-CM | POA: Diagnosis not present

## 2022-08-12 DIAGNOSIS — E039 Hypothyroidism, unspecified: Secondary | ICD-10-CM | POA: Diagnosis not present

## 2022-08-12 DIAGNOSIS — Z0000 Encounter for general adult medical examination without abnormal findings: Secondary | ICD-10-CM | POA: Diagnosis not present

## 2022-08-12 DIAGNOSIS — Z125 Encounter for screening for malignant neoplasm of prostate: Secondary | ICD-10-CM | POA: Diagnosis not present

## 2022-08-12 DIAGNOSIS — Z13220 Encounter for screening for lipoid disorders: Secondary | ICD-10-CM | POA: Diagnosis not present

## 2022-08-12 DIAGNOSIS — E0500 Thyrotoxicosis with diffuse goiter without thyrotoxic crisis or storm: Secondary | ICD-10-CM | POA: Diagnosis not present

## 2022-08-12 DIAGNOSIS — I10 Essential (primary) hypertension: Secondary | ICD-10-CM | POA: Diagnosis not present

## 2022-11-06 DIAGNOSIS — H524 Presbyopia: Secondary | ICD-10-CM | POA: Diagnosis not present

## 2023-01-22 DIAGNOSIS — H2513 Age-related nuclear cataract, bilateral: Secondary | ICD-10-CM | POA: Diagnosis not present

## 2023-01-22 DIAGNOSIS — E0500 Thyrotoxicosis with diffuse goiter without thyrotoxic crisis or storm: Secondary | ICD-10-CM | POA: Diagnosis not present

## 2023-01-22 DIAGNOSIS — H05243 Constant exophthalmos, bilateral: Secondary | ICD-10-CM | POA: Diagnosis not present

## 2023-01-22 DIAGNOSIS — H02535 Eyelid retraction left lower eyelid: Secondary | ICD-10-CM | POA: Diagnosis not present

## 2023-01-22 DIAGNOSIS — H02532 Eyelid retraction right lower eyelid: Secondary | ICD-10-CM | POA: Diagnosis not present

## 2023-01-22 DIAGNOSIS — H02534 Eyelid retraction left upper eyelid: Secondary | ICD-10-CM | POA: Diagnosis not present

## 2023-01-28 DIAGNOSIS — E0500 Thyrotoxicosis with diffuse goiter without thyrotoxic crisis or storm: Secondary | ICD-10-CM | POA: Diagnosis not present

## 2023-02-20 DIAGNOSIS — E0500 Thyrotoxicosis with diffuse goiter without thyrotoxic crisis or storm: Secondary | ICD-10-CM | POA: Diagnosis not present

## 2023-02-20 DIAGNOSIS — H0279 Other degenerative disorders of eyelid and periocular area: Secondary | ICD-10-CM | POA: Diagnosis not present

## 2023-02-20 DIAGNOSIS — H2513 Age-related nuclear cataract, bilateral: Secondary | ICD-10-CM | POA: Diagnosis not present

## 2023-02-20 DIAGNOSIS — H05243 Constant exophthalmos, bilateral: Secondary | ICD-10-CM | POA: Diagnosis not present

## 2023-03-03 DIAGNOSIS — H25813 Combined forms of age-related cataract, bilateral: Secondary | ICD-10-CM | POA: Diagnosis not present

## 2023-03-07 DIAGNOSIS — Z23 Encounter for immunization: Secondary | ICD-10-CM | POA: Diagnosis not present

## 2023-03-21 DIAGNOSIS — H25812 Combined forms of age-related cataract, left eye: Secondary | ICD-10-CM | POA: Diagnosis not present

## 2023-03-31 ENCOUNTER — Ambulatory Visit: Payer: Medicare Other | Attending: Ophthalmology | Admitting: Audiology

## 2023-03-31 DIAGNOSIS — H9042 Sensorineural hearing loss, unilateral, left ear, with unrestricted hearing on the contralateral side: Secondary | ICD-10-CM | POA: Diagnosis not present

## 2023-03-31 NOTE — Procedures (Signed)
  Outpatient Audiology and [facility] [address] [place], [place]  [address] [phone]  AUDIOLOGICAL  EVALUATION  NAME: Lydon Vansickle     DOB:   05/09/1956      MRN: [id number]                                                                                     DATE: 03/31/2023     REFERENT: Georgann Housekeeper, MD STATUS: Outpatient DIAGNOSIS: Sensorineural hearing loss, left ear  History: Trapper was seen for an audiological evaluation to have a baseline audiogram completed. Melquan denies concerns regarding his hearing sensitivity.  He denies otalgia, aural fullness, tinnitus, and dizziness or vertigo.  Shahan's medical history is significant for Graves' disease.  He is about to undergo treatment with Teprotumumab/Tepezza infusion.  Audiological monitoring is recommended before treatment, during treatment, and after treatment.  Evaluation:  Otoscopy showed a clear view of the tympanic membranes, bilaterally Tympanometry results were consistent with normal middle ear pressure and normal tympanic membrane mobility (Type A), bilaterally Audiometric testing was completed using Conventional Audiometry techniques with insert earphones and TDH headphones. Test results are consistent in the right ear with normal hearing sensitivity at 250 Hz to 8000 Hz and are consistent in the left ear with normal hearing sensitivity from 250 Hz to 1000 Hz sloping to a mild sensorineural hearing loss at 2000 Hz to 8000 Hz.  There is an asymmetry noted at 1500 Hz to 6000 Hz, worse in the left ear.  Speech Recognition Thresholds were obtained at 15 dB HL in the right ear and at 20 dB HL in the left ear. Word Recognition Testing was completed at 65 dB HL and Jovani scored 100% bilaterally   Results:  The test results were reviewed with Marita [place].  Today's results from tympanometry showed normal middle ear function.  Audiometric test results are consistent in the right ear with normal hearing sensitivity at  250 Hz to 8000 Hz and are consistent in the left ear with normal hearing sensitivity from 250 Hz to 1000 Hz sloping to a mild sensorineural hearing loss at 2000 Hz to 8000 Hz.  There is an asymmetry noted at 1500 Hz to 6000 Hz, worse in the left ear.  Eriq may have hearing and communication difficulty in adverse listening environments.  He will benefit from the use of good communication strategies.  A referral to an ear nose and throat physician was reviewed with Marita [place] due to his asymmetric hearing loss.   Recommendations: 1.   Referral to an ENT due to asymmetric hearing loss 2.   Return for audiological monitoring   30 minutes spent testing and counseling on results.   If you have any questions please feel free to contact me at [phone].  Marton Redwood Audiologist, Au.D., CCC-A 03/31/2023  2:00 PM  Cc: Georgann Housekeeper, MD

## 2023-04-03 DIAGNOSIS — E0500 Thyrotoxicosis with diffuse goiter without thyrotoxic crisis or storm: Secondary | ICD-10-CM | POA: Diagnosis not present

## 2023-04-04 DIAGNOSIS — H25811 Combined forms of age-related cataract, right eye: Secondary | ICD-10-CM | POA: Diagnosis not present

## 2023-05-01 DIAGNOSIS — E0500 Thyrotoxicosis with diffuse goiter without thyrotoxic crisis or storm: Secondary | ICD-10-CM | POA: Diagnosis not present

## 2023-05-15 DIAGNOSIS — H05243 Constant exophthalmos, bilateral: Secondary | ICD-10-CM | POA: Diagnosis not present

## 2023-05-15 DIAGNOSIS — E0500 Thyrotoxicosis with diffuse goiter without thyrotoxic crisis or storm: Secondary | ICD-10-CM | POA: Diagnosis not present

## 2023-05-15 DIAGNOSIS — H0279 Other degenerative disorders of eyelid and periocular area: Secondary | ICD-10-CM | POA: Diagnosis not present

## 2023-05-15 DIAGNOSIS — H02532 Eyelid retraction right lower eyelid: Secondary | ICD-10-CM | POA: Diagnosis not present

## 2023-05-15 DIAGNOSIS — H02534 Eyelid retraction left upper eyelid: Secondary | ICD-10-CM | POA: Diagnosis not present

## 2023-06-05 DIAGNOSIS — E0500 Thyrotoxicosis with diffuse goiter without thyrotoxic crisis or storm: Secondary | ICD-10-CM | POA: Diagnosis not present

## 2023-06-26 DIAGNOSIS — E0500 Thyrotoxicosis with diffuse goiter without thyrotoxic crisis or storm: Secondary | ICD-10-CM | POA: Diagnosis not present

## 2023-07-17 DIAGNOSIS — E0500 Thyrotoxicosis with diffuse goiter without thyrotoxic crisis or storm: Secondary | ICD-10-CM | POA: Diagnosis not present

## 2023-07-31 DIAGNOSIS — H02534 Eyelid retraction left upper eyelid: Secondary | ICD-10-CM | POA: Diagnosis not present

## 2023-07-31 DIAGNOSIS — H0279 Other degenerative disorders of eyelid and periocular area: Secondary | ICD-10-CM | POA: Diagnosis not present

## 2023-07-31 DIAGNOSIS — H02535 Eyelid retraction left lower eyelid: Secondary | ICD-10-CM | POA: Diagnosis not present

## 2023-07-31 DIAGNOSIS — H05243 Constant exophthalmos, bilateral: Secondary | ICD-10-CM | POA: Diagnosis not present

## 2023-07-31 DIAGNOSIS — E0500 Thyrotoxicosis with diffuse goiter without thyrotoxic crisis or storm: Secondary | ICD-10-CM | POA: Diagnosis not present

## 2023-08-07 DIAGNOSIS — E0500 Thyrotoxicosis with diffuse goiter without thyrotoxic crisis or storm: Secondary | ICD-10-CM | POA: Diagnosis not present

## 2023-08-28 DIAGNOSIS — E0500 Thyrotoxicosis with diffuse goiter without thyrotoxic crisis or storm: Secondary | ICD-10-CM | POA: Diagnosis not present

## 2023-09-05 DIAGNOSIS — E0500 Thyrotoxicosis with diffuse goiter without thyrotoxic crisis or storm: Secondary | ICD-10-CM | POA: Diagnosis not present

## 2023-09-18 DIAGNOSIS — E0500 Thyrotoxicosis with diffuse goiter without thyrotoxic crisis or storm: Secondary | ICD-10-CM | POA: Diagnosis not present

## 2023-12-04 DIAGNOSIS — H02534 Eyelid retraction left upper eyelid: Secondary | ICD-10-CM | POA: Diagnosis not present

## 2023-12-04 DIAGNOSIS — E0500 Thyrotoxicosis with diffuse goiter without thyrotoxic crisis or storm: Secondary | ICD-10-CM | POA: Diagnosis not present

## 2023-12-04 DIAGNOSIS — H02055 Trichiasis without entropian left lower eyelid: Secondary | ICD-10-CM | POA: Diagnosis not present

## 2023-12-04 DIAGNOSIS — H0279 Other degenerative disorders of eyelid and periocular area: Secondary | ICD-10-CM | POA: Diagnosis not present

## 2023-12-04 DIAGNOSIS — H05243 Constant exophthalmos, bilateral: Secondary | ICD-10-CM | POA: Diagnosis not present

## 2023-12-04 DIAGNOSIS — H04123 Dry eye syndrome of bilateral lacrimal glands: Secondary | ICD-10-CM | POA: Diagnosis not present

## 2024-02-18 DIAGNOSIS — J069 Acute upper respiratory infection, unspecified: Secondary | ICD-10-CM | POA: Diagnosis not present

## 2024-03-01 DIAGNOSIS — E0500 Thyrotoxicosis with diffuse goiter without thyrotoxic crisis or storm: Secondary | ICD-10-CM | POA: Diagnosis not present

## 2024-03-01 DIAGNOSIS — Z23 Encounter for immunization: Secondary | ICD-10-CM | POA: Diagnosis not present

## 2024-03-01 DIAGNOSIS — I10 Essential (primary) hypertension: Secondary | ICD-10-CM | POA: Diagnosis not present

## 2024-03-01 DIAGNOSIS — E039 Hypothyroidism, unspecified: Secondary | ICD-10-CM | POA: Diagnosis not present

## 2024-03-01 DIAGNOSIS — E7800 Pure hypercholesterolemia, unspecified: Secondary | ICD-10-CM | POA: Diagnosis not present

## 2024-03-01 DIAGNOSIS — Z125 Encounter for screening for malignant neoplasm of prostate: Secondary | ICD-10-CM | POA: Diagnosis not present

## 2024-03-01 DIAGNOSIS — Z0000 Encounter for general adult medical examination without abnormal findings: Secondary | ICD-10-CM | POA: Diagnosis not present

## 2024-03-04 DIAGNOSIS — D126 Benign neoplasm of colon, unspecified: Secondary | ICD-10-CM | POA: Diagnosis not present

## 2024-03-04 DIAGNOSIS — I10 Essential (primary) hypertension: Secondary | ICD-10-CM | POA: Diagnosis not present

## 2024-03-04 DIAGNOSIS — Z6832 Body mass index (BMI) 32.0-32.9, adult: Secondary | ICD-10-CM | POA: Diagnosis not present

## 2024-03-08 DIAGNOSIS — Z23 Encounter for immunization: Secondary | ICD-10-CM | POA: Diagnosis not present

## 2024-03-09 ENCOUNTER — Telehealth: Payer: Self-pay

## 2024-03-09 ENCOUNTER — Other Ambulatory Visit (INDEPENDENT_AMBULATORY_CARE_PROVIDER_SITE_OTHER): Payer: Self-pay

## 2024-03-09 ENCOUNTER — Ambulatory Visit: Admitting: Orthopaedic Surgery

## 2024-03-09 DIAGNOSIS — M1711 Unilateral primary osteoarthritis, right knee: Secondary | ICD-10-CM | POA: Diagnosis not present

## 2024-03-09 DIAGNOSIS — G8929 Other chronic pain: Secondary | ICD-10-CM

## 2024-03-09 DIAGNOSIS — M25561 Pain in right knee: Secondary | ICD-10-CM

## 2024-03-09 NOTE — Telephone Encounter (Signed)
 Patient given surgical clearance form. Aware that we must receive clearance form back before being able to proceed with scheduling surgery.

## 2024-03-09 NOTE — Progress Notes (Signed)
 Office Visit Note   Patient: Jordan Barajas           Date of Birth: February 15, 1956           MRN: [id number] Visit Date: 03/09/2024              Requested by: Ransom Other, MD [address] [place],  [place] [address] PCP: Ransom Other, MD   Assessment & Plan: Visit Diagnoses:  1. Primary osteoarthritis of right knee     Plan: History of Present Illness Jordan Barajas is a 68 year old male who presents with right knee pain and consideration for surgery.  He experiences significant right knee pain and seeks surgical intervention to achieve results similar to his successful left knee replacement. He plans to have the surgery in the next month or two, after finalizing the sale of his house in [place]. He is concerned about potential scheduling conflicts with the house sale. There is soft tissue swelling on the side of his right knee, which is irritated by wearing a knee pad.  Examination right knee shows varus deformity, medial joint tenderness.  Pain with range of motion.  Results RADIOLOGY Knee X-ray: Well-positioned left knee arthroplasty, no displacement or loosening. Right knee shows bone-on-bone osteoarthritis. (03/09/2024)  Assessment and Plan Impression is severe right knee degenerative joint disease secondary to Osteoarthritis.  Patient has attempted conservative treatment for at least 6 consecutive weeks within the past 12 weeks, including but not limited to physical therapy, home exercise program, NSAIDs, activity modification, and/or corticosteroid injections. Despite these efforts, symptoms have not improved or have worsened. Conservative measures have been deemed unsuccessful at this time. After a detailed discussion covering diagnosis and treatment options--including the risks, benefits, alternatives, and potential complications of surgical and nonsurgical management--the patient elected to proceed with surgery  Anticoagulants: No antithrombotic Postop  anticoagulation: Eliquis Diabetic: No  Nickel allergy: No Prior DVT/PE: No Tobacco use: No Clearances needed for surgery: PCP Anticipated discharge dispo: Home   Follow-Up Instructions: No follow-ups on file.   Orders:  Orders Placed This Encounter  Procedures   XR KNEE 3 VIEW RIGHT   No orders of the defined types were placed in this encounter.     Procedures: No procedures performed   Clinical Data: No additional findings.   Subjective: Chief Complaint  Patient presents with   Right Knee - Pain    HPI  Review of Systems  Constitutional: Negative.   HENT: Negative.    Eyes: Negative.   Respiratory: Negative.    Cardiovascular: Negative.   Gastrointestinal: Negative.   Endocrine: Negative.   Genitourinary: Negative.   Skin: Negative.   Allergic/Immunologic: Negative.   Neurological: Negative.   Hematological: Negative.   Psychiatric/Behavioral: Negative.    All other systems reviewed and are negative.    Objective: Vital Signs: There were no vitals taken for this visit.  Physical Exam Vitals and nursing note reviewed.  Constitutional:      Appearance: He is well-developed.  HENT:     Head: Normocephalic and atraumatic.  Eyes:     Pupils: Pupils are equal, round, and reactive to light.  Pulmonary:     Effort: Pulmonary effort is normal.  Abdominal:     Palpations: Abdomen is soft.  Musculoskeletal:        General: Normal range of motion.     Cervical back: Neck supple.  Skin:    General: Skin is warm.  Neurological:     Mental Status: He  is alert and oriented to person, place, and time.  Psychiatric:        Behavior: Behavior normal.        Thought Content: Thought content normal.        Judgment: Judgment normal.     Ortho Exam  Specialty Comments:  No specialty comments available.  Imaging: XR KNEE 3 VIEW RIGHT Result Date: 03/09/2024 X-rays demonstrate severe tricompartmental osteoarthritis.  Bone-on-bone joint space  narrowing.  Kellgren-Lawrence stage IV    PMFS History: Patient Active Problem List   Diagnosis Date Noted   Primary osteoarthritis of right knee 12/16/2019   Status post total left knee replacement 07/19/2019   Primary osteoarthritis of left knee 07/18/2019   Bilateral primary osteoarthritis of knee 06/18/2018   Hyperthyroidism 02/01/2016   Past Medical History:  Diagnosis Date   Arthritis    Hyperthyroidism 02/01/2016    Family History  Problem Relation Age of Onset   Thyroid  disease Neg Hx     Past Surgical History:  Procedure Laterality Date   APPENDECTOMY     as a child   TOTAL KNEE ARTHROPLASTY Left 07/19/2019   Procedure: LEFT TOTAL KNEE ARTHROPLASTY;  Surgeon: Jerri Kay HERO, MD;  Location: [facility] OR;  Service: Orthopedics;  Laterality: Left;   Social History   Occupational History   Not on file  Tobacco Use   Smoking status: Never   Smokeless tobacco: Never  Vaping Use   Vaping status: Never Used  Substance and Sexual Activity   Alcohol use: No   Drug use: No   Sexual activity: Not on file

## 2024-03-16 ENCOUNTER — Telehealth: Payer: Self-pay | Admitting: Internal Medicine

## 2024-03-16 NOTE — Telephone Encounter (Signed)
 Called patient to offer available surgery dates for total knee replacement with Dr. Jerri.  Patient has been cleared by PCP but is not ready to schedule yet.  Patient will call once he has a look at his calendar.

## 2024-03-29 ENCOUNTER — Encounter: Payer: Self-pay | Admitting: Radiology

## 2024-03-29 DIAGNOSIS — K635 Polyp of colon: Secondary | ICD-10-CM | POA: Diagnosis not present

## 2024-05-25 ENCOUNTER — Telehealth: Payer: Self-pay | Admitting: Orthopaedic Surgery

## 2024-05-25 NOTE — Telephone Encounter (Signed)
 Pt states he is ready to schedule his surgery

## 2024-06-11 ENCOUNTER — Encounter (HOSPITAL_COMMUNITY): Payer: Self-pay

## 2024-06-11 NOTE — Pre-Procedure Instructions (Signed)
 Surgical Instructions   Your procedure is scheduled on June 21, 2024. Report to [facility] Main Entrance A at 6:00 A.M., then check in with the Admitting office. Any questions or running late day of surgery: call [phone]  Questions prior to your surgery date: call [phone], Monday-Friday, 8am-4pm. If you experience any cold or flu symptoms such as cough, fever, chills, shortness of breath, etc. between now and your scheduled surgery, please notify us  at the above number.     Remember:  Do not eat after midnight the night before your surgery  You may drink clear liquids until 5:30 AM the morning of your surgery.   Clear liquids allowed are: Water , Non-Citrus Juices (without pulp), Carbonated Beverages, Clear Tea (no milk, honey, etc.), Black Coffee Only (NO MILK, CREAM OR POWDERED CREAMER of any kind), and Gatorade.  Patient Instructions  The night before surgery:  No food after midnight. ONLY clear liquids after midnight  The day of surgery (if you do NOT have diabetes):  Drink ONE (1) Pre-Surgery Clear Ensure by 5:30 AM the morning of surgery. Drink in one sitting. Do not sip.  This drink was given to you during your hospital  pre-op appointment visit.  Nothing else to drink after completing the  Pre-Surgery Clear Ensure.         If you have questions, please contact your surgeons office.    Take these medicines the morning of surgery with A SIP OF WATER : amLODipine  (NORVASC )  SYNTHROID     One week prior to surgery, STOP taking any Aspirin  (unless otherwise instructed by your surgeon) Aleve, Naproxen, Ibuprofen, Motrin, Advil, Goody's, BC's, all herbal medications, fish oil, and non-prescription vitamins.                     Do NOT Smoke (Tobacco/Vaping) for 24 hours prior to your procedure.  If you use a CPAP at night, you may bring your mask/headgear for your overnight stay.   You will be asked to remove any contacts, glasses, piercing's, hearing aid's,  dentures/partials prior to surgery. Please bring cases for these items if needed.    Your surgeon will determine if you are to be admitted or discharged the same day.  Patients discharged the day of surgery will not be allowed to drive home, and someone needs to stay with them for 24 hours.  SURGICAL WAITING ROOM VISITATION Patients may have no more than 2 support people in the waiting area - these visitors may rotate.   Pre-op nurse will coordinate an appropriate time for 2 ADULT support persons, who may not rotate, to accompany patient in pre-op.  Children under the age of 34 must have an adult with them who is not the patient and must remain in the main waiting area with an adult.  If the patient needs to stay at the hospital during part of their recovery, the visitor guidelines for inpatient rooms apply.  Please refer to the [facility] website for the visitor guidelines for any additional information.   If you received a COVID test during your pre-op visit  it is requested that you wear a mask when out in public, stay away from anyone that may not be feeling well and notify your surgeon if you develop symptoms. If you have been in contact with anyone that has tested positive in the last 10 days please notify you surgeon.      Pre-operative 4 CHG Bathing Instructions   You can play a key role  in reducing the risk of infection after surgery. Your skin needs to be as free of germs as possible. You can reduce the number of germs on your skin by washing with CHG (chlorhexidine  gluconate) soap before surgery. CHG is an antiseptic soap that kills germs and continues to kill germs even after washing.   DO NOT use if you have an allergy to chlorhexidine /CHG or antibacterial soaps. If your skin becomes reddened or irritated, stop using the CHG and notify one of our RNs at [phone].   Please shower with the CHG soap starting 4 days before surgery using the following schedule:     Please  keep in mind the following:  DO NOT shave, including legs and underarms, starting the day of your first shower.   You may shave your face at any point before/day of surgery.  Place clean sheets on your bed the day you start using CHG soap. Use a clean washcloth (not used since being washed) for each shower. DO NOT sleep with pets once you start using the CHG.   CHG Shower Instructions:  Wash your face and private area with normal soap. If you choose to wash your hair, wash first with your normal shampoo.  After you use shampoo/soap, rinse your hair and body thoroughly to remove shampoo/soap residue.  Turn the water  OFF and apply  bottle of CHG soap to a CLEAN washcloth.  Apply CHG soap ONLY FROM YOUR NECK DOWN TO YOUR TOES (washing for 3-5 minutes)  DO NOT use CHG soap on face, private areas, open wounds, or sores.  Pay special attention to the area where your surgery is being performed.  If you are having back surgery, having someone wash your back for you may be helpful. Wait 2 minutes after CHG soap is applied, then you may rinse off the CHG soap.  Pat dry with a clean towel  Put on clean clothes/pajamas   If you choose to wear lotion, please use ONLY the CHG-compatible lotions that are listed below.  Additional instructions for the day of surgery:  If you choose, you may shower the morning of surgery with an antibacterial soap.  DO NOT APPLY any lotions, deodorants, cologne, or perfumes.   Do not bring valuables to the hospital. [facility] is not responsible for any belongings/valuables. Do not wear nail polish, gel polish, artificial nails, or any other type of covering on natural nails (fingers and toes) Do not wear jewelry or makeup Put on clean/comfortable clothes.  Please brush your teeth.  Ask your nurse before applying any prescription medications to the skin.     CHG Compatible Lotions   Aveeno Moisturizing lotion  Cetaphil Moisturizing Cream  Cetaphil Moisturizing  Lotion  Clairol Herbal Essence Moisturizing Lotion, Dry Skin  Clairol Herbal Essence Moisturizing Lotion, Extra Dry Skin  Clairol Herbal Essence Moisturizing Lotion, Normal Skin  Curel Age Defying Therapeutic Moisturizing Lotion with Alpha Hydroxy  Curel Extreme Care Body Lotion  Curel Soothing Hands Moisturizing Hand Lotion  Curel Therapeutic Moisturizing Cream, Fragrance-Free  Curel Therapeutic Moisturizing Lotion, Fragrance-Free  Curel Therapeutic Moisturizing Lotion, Original Formula  Eucerin Daily Replenishing Lotion  Eucerin Dry Skin Therapy Plus Alpha Hydroxy Crme  Eucerin Dry Skin Therapy Plus Alpha Hydroxy Lotion  Eucerin Original Crme  Eucerin Original Lotion  Eucerin Plus Crme Eucerin Plus Lotion  Eucerin TriLipid Replenishing Lotion  Keri Anti-Bacterial Hand Lotion  Keri Deep Conditioning Original Lotion Dry Skin Formula Softly Scented  Keri Deep Conditioning Original Lotion, Fragrance Free Sensitive  Skin Formula  Keri Lotion Fast Absorbing Fragrance Free Sensitive Skin Formula  Keri Lotion Fast Absorbing Softly Scented Dry Skin Formula  Keri Original Lotion  Keri Skin Renewal Lotion Keri Silky Smooth Lotion  Keri Silky Smooth Sensitive Skin Lotion  Nivea Body Creamy Conditioning Oil  Nivea Body Extra Enriched Lotion  Nivea Body Original Lotion  Nivea Body Sheer Moisturizing Lotion Nivea Crme  Nivea Skin Firming Lotion  NutraDerm 30 Skin Lotion  NutraDerm Skin Lotion  NutraDerm Therapeutic Skin Cream  NutraDerm Therapeutic Skin Lotion  ProShield Protective Hand Cream  Provon moisturizing lotion  Please read over the following fact sheets that you were given.

## 2024-06-14 ENCOUNTER — Encounter (HOSPITAL_COMMUNITY)
Admission: RE | Admit: 2024-06-14 | Discharge: 2024-06-14 | Disposition: A | Source: Ambulatory Visit | Attending: Orthopaedic Surgery | Admitting: Orthopaedic Surgery

## 2024-06-14 ENCOUNTER — Encounter (HOSPITAL_COMMUNITY): Payer: Self-pay

## 2024-06-14 ENCOUNTER — Telehealth: Payer: Self-pay | Admitting: Orthopaedic Surgery

## 2024-06-14 ENCOUNTER — Ambulatory Visit: Payer: Self-pay | Admitting: Orthopaedic Surgery

## 2024-06-14 ENCOUNTER — Other Ambulatory Visit: Payer: Self-pay

## 2024-06-14 ENCOUNTER — Other Ambulatory Visit: Payer: Self-pay | Admitting: Physician Assistant

## 2024-06-14 VITALS — BP 144/95 | HR 68 | Temp 98.3°F | Resp 18 | Ht 69.0 in | Wt 223.0 lb

## 2024-06-14 DIAGNOSIS — M1711 Unilateral primary osteoarthritis, right knee: Secondary | ICD-10-CM | POA: Diagnosis not present

## 2024-06-14 DIAGNOSIS — Z01810 Encounter for preprocedural cardiovascular examination: Secondary | ICD-10-CM | POA: Insufficient documentation

## 2024-06-14 DIAGNOSIS — Z01818 Encounter for other preprocedural examination: Secondary | ICD-10-CM

## 2024-06-14 HISTORY — DX: Sleep apnea, unspecified: G47.30

## 2024-06-14 HISTORY — DX: Hypothyroidism, unspecified: E03.9

## 2024-06-14 HISTORY — DX: Essential (primary) hypertension: I10

## 2024-06-14 LAB — CBC
HCT: 39.7 % (ref 39.0–52.0)
Hemoglobin: 14.3 g/dL (ref 13.0–17.0)
MCH: 28.8 pg (ref 26.0–34.0)
MCHC: 36 g/dL (ref 30.0–36.0)
MCV: 80 fL (ref 80.0–100.0)
Platelets: 206 K/uL (ref 150–400)
RBC: 4.96 MIL/uL (ref 4.22–5.81)
RDW: 13.3 % (ref 11.5–15.5)
WBC: 4.3 K/uL (ref 4.0–10.5)
nRBC: 0 % (ref 0.0–0.2)

## 2024-06-14 LAB — SURGICAL PCR SCREEN
MRSA, PCR: POSITIVE — AB
Staphylococcus aureus: POSITIVE — AB

## 2024-06-14 LAB — BASIC METABOLIC PANEL WITH GFR
Anion gap: 10 (ref 5–15)
BUN: 13 mg/dL (ref 8–23)
CO2: 26 mmol/L (ref 22–32)
Calcium: 9.5 mg/dL (ref 8.9–10.3)
Chloride: 103 mmol/L (ref 98–111)
Creatinine, Ser: 1.21 mg/dL (ref 0.61–1.24)
GFR, Estimated: >60 mL/min
Glucose, Bld: 98 mg/dL (ref 70–99)
Potassium: 4.3 mmol/L (ref 3.5–5.1)
Sodium: 139 mmol/L (ref 135–145)

## 2024-06-14 MED ORDER — DOXYCYCLINE HYCLATE 100 MG PO TABS: 100.0000 mg | ORAL_TABLET | Freq: Two times a day (BID) | ORAL | 0 refills | Status: AC

## 2024-06-14 NOTE — Progress Notes (Signed)
 PCP - Dr. Ardell Drought, [facility] Cardiologist - denies  PPM/ICD - denies   Chest x-ray - denies EKG - done within the last year at PCP per pt, records requested Stress Test - denies ECHO - denies Cardiac Cath - denies  Sleep Study - yes CPAP - denies cpap but wears oral device  No DM.  Last dose of GLP1 agonist-  N/A  Blood Thinner Instructions: N/A Aspirin  Instructions:N/A  ERAS Protcol - Clear liquids until 05:30 am PRE-SURGERY Ensure   COVID TEST- N/A   Anesthesia review: yes, f/u records request for EKG   Patient denies shortness of breath, fever, cough and chest pain at PAT appointment. Patient denies any respiratory symptoms in the last two weeks.    All instructions explained to the patient, with a verbal understanding of the material. Patient agrees to go over the instructions while at home for a better understanding. The opportunity to ask questions was provided.

## 2024-06-14 NOTE — Telephone Encounter (Signed)
 thnx

## 2024-06-14 NOTE — Progress Notes (Signed)
MRSA positive

## 2024-06-14 NOTE — Telephone Encounter (Signed)
 Patient is scheduled 06/21/24  at [facility] for RIGHT TKA.  Brittney with pre-op called and said the swab came back positive for MRSA & Staph

## 2024-06-17 ENCOUNTER — Other Ambulatory Visit: Payer: Self-pay | Admitting: Physician Assistant

## 2024-06-17 MED ORDER — OXYCODONE-ACETAMINOPHEN 5-325 MG PO TABS: 1.0000 | ORAL_TABLET | Freq: Three times a day (TID) | ORAL | 0 refills | Status: AC | PRN

## 2024-06-17 MED ORDER — DOCUSATE SODIUM 100 MG PO CAPS: 100.0000 mg | ORAL_CAPSULE | Freq: Every day | ORAL | 2 refills | Status: AC | PRN

## 2024-06-17 MED ORDER — ONDANSETRON HCL 4 MG PO TABS: 4.0000 mg | ORAL_TABLET | Freq: Three times a day (TID) | ORAL | 0 refills | Status: AC | PRN

## 2024-06-17 MED ORDER — METHOCARBAMOL 500 MG PO TABS: 500.0000 mg | ORAL_TABLET | Freq: Two times a day (BID) | ORAL | 2 refills | Status: AC | PRN

## 2024-06-17 MED ORDER — ASPIRIN 81 MG PO CHEW: 81.0000 mg | CHEWABLE_TABLET | Freq: Two times a day (BID) | ORAL | 0 refills | Status: AC

## 2024-06-18 MED ORDER — TRANEXAMIC ACID 1000 MG/10ML IV SOLN
2000.0000 mg | INTRAVENOUS | Status: DC
  Filled 2024-06-18 (×2): qty 20

## 2024-06-20 NOTE — Anesthesia Preprocedure Evaluation (Signed)
"                                    Anesthesia Evaluation  Patient identified by MRN, date of birth, ID band Patient awake    Reviewed: Allergy & Precautions, H&P , NPO status , Patient's Chart, lab work & pertinent test results  Airway Mallampati: IV  TM Distance: >3 FB Neck ROM: Full    Dental  (+) Teeth Intact, Dental Advisory Given   Pulmonary sleep apnea (uses mouth piece)    Pulmonary exam normal breath sounds clear to auscultation       Cardiovascular hypertension (154/98 preop), Pt. on medications Normal cardiovascular exam Rhythm:Regular Rate:Normal     Neuro/Psych negative neurological ROS  negative psych ROS   GI/Hepatic negative GI ROS, Neg liver ROS,,,  Endo/Other  Hypothyroidism  BMI 33  Renal/GU negative Renal ROS  negative genitourinary   Musculoskeletal  (+) Arthritis ,    Abdominal  (+) + obese  Peds negative pediatric ROS (+)  Hematology Hb 14.3, plt 206k   Anesthesia Other Findings   Reproductive/Obstetrics negative OB ROS                              Anesthesia Physical Anesthesia Plan  ASA: 2  Anesthesia Plan: MAC, Regional and Spinal   Post-op Pain Management: Regional block* and Tylenol  PO (pre-op)*   Induction:   PONV Risk Score and Plan: 1 and Treatment may vary due to age or medical condition, Propofol  infusion, TIVA and Midazolam   Airway Management Planned: Natural Airway and Simple Face Mask  Additional Equipment: None  Intra-op Plan:   Post-operative Plan:   Informed Consent: I have reviewed the patients History and Physical, chart, labs and discussed the procedure including the risks, benefits and alternatives for the proposed anesthesia with the patient or authorized representative who has indicated his/her understanding and acceptance.       Plan Discussed with: CRNA  Anesthesia Plan Comments: (Plan routine monitors, SAB with adductor canal block for post op analgesia)          Anesthesia Quick Evaluation  "

## 2024-06-21 ENCOUNTER — Encounter (HOSPITAL_COMMUNITY): Admission: RE | Disposition: A | Payer: Self-pay | Source: Home / Self Care | Attending: Orthopaedic Surgery

## 2024-06-21 ENCOUNTER — Observation Stay (HOSPITAL_COMMUNITY)
Admission: RE | Admit: 2024-06-21 | Discharge: 2024-06-22 | Disposition: A | Attending: Orthopaedic Surgery | Admitting: Orthopaedic Surgery

## 2024-06-21 ENCOUNTER — Ambulatory Visit (HOSPITAL_BASED_OUTPATIENT_CLINIC_OR_DEPARTMENT_OTHER): Payer: Self-pay | Admitting: Anesthesiology

## 2024-06-21 ENCOUNTER — Ambulatory Visit (HOSPITAL_COMMUNITY): Payer: Self-pay | Admitting: Physician Assistant

## 2024-06-21 ENCOUNTER — Observation Stay (HOSPITAL_COMMUNITY)

## 2024-06-21 DIAGNOSIS — E039 Hypothyroidism, unspecified: Secondary | ICD-10-CM | POA: Insufficient documentation

## 2024-06-21 DIAGNOSIS — I10 Essential (primary) hypertension: Secondary | ICD-10-CM | POA: Diagnosis not present

## 2024-06-21 DIAGNOSIS — Z96652 Presence of left artificial knee joint: Secondary | ICD-10-CM | POA: Diagnosis not present

## 2024-06-21 DIAGNOSIS — Z79899 Other long term (current) drug therapy: Secondary | ICD-10-CM | POA: Diagnosis not present

## 2024-06-21 DIAGNOSIS — Z96651 Presence of right artificial knee joint: Secondary | ICD-10-CM

## 2024-06-21 DIAGNOSIS — M1711 Unilateral primary osteoarthritis, right knee: Principal | ICD-10-CM | POA: Diagnosis present

## 2024-06-21 DIAGNOSIS — M25561 Pain in right knee: Secondary | ICD-10-CM | POA: Diagnosis present

## 2024-06-21 LAB — GLUCOSE, CAPILLARY: Glucose-Capillary: 211 mg/dL — ABNORMAL HIGH (ref 70–99)

## 2024-06-21 MED ORDER — OXYCODONE HCL 5 MG PO TABS: 5.0000 mg | ORAL_TABLET | Freq: Once | ORAL | Status: DC | PRN

## 2024-06-21 MED ORDER — TRANEXAMIC ACID 1000 MG/10ML IV SOLN
INTRAVENOUS | Status: DC | PRN
  Administered 2024-06-21: 2000 mg via TOPICAL

## 2024-06-21 MED ORDER — SODIUM CHLORIDE 0.9 % IR SOLN
Status: DC | PRN
  Administered 2024-06-21: 1000 mL

## 2024-06-21 MED ORDER — ACETAMINOPHEN 500 MG PO TABS: 1000.0000 mg | ORAL_TABLET | Freq: Once | ORAL | Status: DC

## 2024-06-21 MED ORDER — LACTATED RINGERS IV SOLN: INTRAVENOUS | Status: DC

## 2024-06-21 MED ORDER — ORAL CARE MOUTH RINSE: 15.0000 mL | Freq: Once | OROMUCOSAL | Status: AC

## 2024-06-21 MED ORDER — MENTHOL 3 MG MT LOZG: 1.0000 | LOZENGE | OROMUCOSAL | Status: DC | PRN

## 2024-06-21 MED ORDER — ISOPROPYL ALCOHOL 70 % SOLN
Status: DC | PRN
  Administered 2024-06-21: 1 via TOPICAL

## 2024-06-21 MED ORDER — ACETAMINOPHEN 325 MG PO TABS: 325.0000 mg | ORAL_TABLET | Freq: Four times a day (QID) | ORAL | Status: DC | PRN

## 2024-06-21 MED ORDER — POVIDONE-IODINE 10 % EX SWAB
2.0000 | Freq: Once | CUTANEOUS | Status: AC
  Administered 2024-06-21: 2 via TOPICAL

## 2024-06-21 MED ORDER — CEFAZOLIN SODIUM-DEXTROSE 2-4 GM/100ML-% IV SOLN
2.0000 g | Freq: Four times a day (QID) | INTRAVENOUS | Status: AC
  Administered 2024-06-21 (×2): 2 g via INTRAVENOUS
  Filled 2024-06-21 (×2): qty 100

## 2024-06-21 MED ORDER — DEXAMETHASONE SOD PHOSPHATE PF 10 MG/ML IJ SOLN
INTRAMUSCULAR | Status: DC | PRN
  Administered 2024-06-21: 10 mg

## 2024-06-21 MED ORDER — ONDANSETRON HCL 4 MG/2ML IJ SOLN: 4.0000 mg | Freq: Four times a day (QID) | INTRAMUSCULAR | Status: DC | PRN

## 2024-06-21 MED ORDER — TRANEXAMIC ACID-NACL 1000-0.7 MG/100ML-% IV SOLN
1000.0000 mg | INTRAVENOUS | Status: AC
  Administered 2024-06-21: 1000 mg via INTRAVENOUS

## 2024-06-21 MED ORDER — DOCUSATE SODIUM 100 MG PO CAPS
100.0000 mg | ORAL_CAPSULE | Freq: Two times a day (BID) | ORAL | Status: DC
  Administered 2024-06-21 – 2024-06-22 (×2): 100 mg via ORAL
  Filled 2024-06-21 (×2): qty 1

## 2024-06-21 MED ORDER — BUPIVACAINE-MELOXICAM ER 400-12 MG/14ML IJ SOLN
INTRAMUSCULAR | Status: AC
  Filled 2024-06-21: qty 1

## 2024-06-21 MED ORDER — METOCLOPRAMIDE HCL 5 MG PO TABS: 5.0000 mg | ORAL_TABLET | Freq: Three times a day (TID) | ORAL | Status: DC | PRN

## 2024-06-21 MED ORDER — PROPOFOL 500 MG/50ML IV EMUL
INTRAVENOUS | Status: DC | PRN
  Administered 2024-06-21: 50 ug/kg/min via INTRAVENOUS

## 2024-06-21 MED ORDER — LEVOTHYROXINE SODIUM 137 MCG PO TABS
137.0000 ug | ORAL_TABLET | Freq: Every day | ORAL | Status: DC
  Administered 2024-06-22: 137 ug via ORAL
  Filled 2024-06-21: qty 1

## 2024-06-21 MED ORDER — PROPOFOL 10 MG/ML IV BOLUS
INTRAVENOUS | Status: AC
  Filled 2024-06-21: qty 20

## 2024-06-21 MED ORDER — CHLORHEXIDINE GLUCONATE 4 % EX SOLN: 1.0000 | CUTANEOUS | 1 refills | Status: AC

## 2024-06-21 MED ORDER — ROPIVACAINE HCL 5 MG/ML IJ SOLN
INTRAMUSCULAR | Status: DC | PRN
  Administered 2024-06-21: 30 mL via PERINEURAL

## 2024-06-21 MED ORDER — MIDAZOLAM HCL 2 MG/2ML IJ SOLN
INTRAMUSCULAR | Status: AC
  Filled 2024-06-21: qty 2

## 2024-06-21 MED ORDER — KETOROLAC TROMETHAMINE 15 MG/ML IJ SOLN
7.5000 mg | Freq: Four times a day (QID) | INTRAMUSCULAR | Status: AC
  Administered 2024-06-21 – 2024-06-22 (×3): 7.5 mg via INTRAVENOUS
  Filled 2024-06-21 (×3): qty 1

## 2024-06-21 MED ORDER — OXYCODONE HCL 5 MG/5ML PO SOLN: 5.0000 mg | Freq: Once | ORAL | Status: DC | PRN

## 2024-06-21 MED ORDER — APIXABAN 2.5 MG PO TABS
2.5000 mg | ORAL_TABLET | Freq: Two times a day (BID) | ORAL | Status: DC
  Administered 2024-06-22: 2.5 mg via ORAL
  Filled 2024-06-21: qty 1

## 2024-06-21 MED ORDER — 0.9 % SODIUM CHLORIDE (POUR BTL) OPTIME
TOPICAL | Status: DC | PRN
  Administered 2024-06-21: 1000 mL

## 2024-06-21 MED ORDER — VANCOMYCIN HCL 1000 MG IV SOLR
INTRAVENOUS | Status: DC | PRN
  Administered 2024-06-21: 1000 mg via TOPICAL

## 2024-06-21 MED ORDER — PRONTOSAN WOUND IRRIGATION OPTIME
TOPICAL | Status: DC | PRN
  Administered 2024-06-21: 350 mL

## 2024-06-21 MED ORDER — PHENOL 1.4 % MT LIQD: 1.0000 | OROMUCOSAL | Status: DC | PRN

## 2024-06-21 MED ORDER — DEXMEDETOMIDINE HCL IN NACL 80 MCG/20ML IV SOLN
INTRAVENOUS | Status: DC | PRN
  Administered 2024-06-21: 16 ug via INTRAVENOUS

## 2024-06-21 MED ORDER — HYDROMORPHONE HCL 1 MG/ML IJ SOLN
INTRAMUSCULAR | Status: AC
  Filled 2024-06-21: qty 1

## 2024-06-21 MED ORDER — VANCOMYCIN HCL IN DEXTROSE 1-5 GM/200ML-% IV SOLN
1000.0000 mg | Freq: Two times a day (BID) | INTRAVENOUS | Status: AC
  Administered 2024-06-21: 1000 mg via INTRAVENOUS
  Filled 2024-06-21: qty 200

## 2024-06-21 MED ORDER — FENTANYL CITRATE (PF) 250 MCG/5ML IJ SOLN
INTRAMUSCULAR | Status: AC
  Filled 2024-06-21: qty 5

## 2024-06-21 MED ORDER — ONDANSETRON HCL 4 MG PO TABS: 4.0000 mg | ORAL_TABLET | Freq: Four times a day (QID) | ORAL | Status: DC | PRN

## 2024-06-21 MED ORDER — SODIUM CHLORIDE 0.9 % IV SOLN: INTRAVENOUS | Status: DC

## 2024-06-21 MED ORDER — VANCOMYCIN HCL IN DEXTROSE 1-5 GM/200ML-% IV SOLN
INTRAVENOUS | Status: AC
  Administered 2024-06-21: 1000 mg via INTRAVENOUS
  Filled 2024-06-21: qty 200

## 2024-06-21 MED ORDER — FENTANYL CITRATE (PF) 250 MCG/5ML IJ SOLN
INTRAMUSCULAR | Status: DC | PRN
  Administered 2024-06-21 (×4): 50 ug via INTRAVENOUS

## 2024-06-21 MED ORDER — CEFAZOLIN SODIUM-DEXTROSE 2-4 GM/100ML-% IV SOLN
INTRAVENOUS | Status: AC
  Filled 2024-06-21: qty 100

## 2024-06-21 MED ORDER — METOCLOPRAMIDE HCL 5 MG/ML IJ SOLN: 5.0000 mg | Freq: Three times a day (TID) | INTRAMUSCULAR | Status: DC | PRN

## 2024-06-21 MED ORDER — PHENYLEPHRINE HCL-NACL 20-0.9 MG/250ML-% IV SOLN
INTRAVENOUS | Status: DC | PRN
  Administered 2024-06-21: 20 ug/min via INTRAVENOUS

## 2024-06-21 MED ORDER — DEXAMETHASONE SOD PHOSPHATE PF 10 MG/ML IJ SOLN: 10.0000 mg | Freq: Once | INTRAMUSCULAR | Status: DC

## 2024-06-21 MED ORDER — CHLORHEXIDINE GLUCONATE 0.12 % MT SOLN: 15.0000 mL | Freq: Once | OROMUCOSAL | Status: AC

## 2024-06-21 MED ORDER — ONDANSETRON HCL 4 MG/2ML IJ SOLN
INTRAMUSCULAR | Status: DC | PRN
  Administered 2024-06-21: 4 mg via INTRAVENOUS

## 2024-06-21 MED ORDER — ACETAMINOPHEN 500 MG PO TABS
ORAL_TABLET | ORAL | Status: AC
  Administered 2024-06-21: 1000 mg via ORAL
  Filled 2024-06-21: qty 2

## 2024-06-21 MED ORDER — LEVOTHYROXINE SODIUM 75 MCG PO TABS: 150.0000 ug | ORAL_TABLET | Freq: Every day | ORAL | Status: DC

## 2024-06-21 MED ORDER — DIPHENHYDRAMINE HCL 50 MG/ML IJ SOLN
INTRAMUSCULAR | Status: DC | PRN
  Administered 2024-06-21: 25 mg via INTRAVENOUS

## 2024-06-21 MED ORDER — CHLORHEXIDINE GLUCONATE 0.12 % MT SOLN
OROMUCOSAL | Status: AC
  Administered 2024-06-21: 15 mL via OROMUCOSAL
  Filled 2024-06-21: qty 15

## 2024-06-21 MED ORDER — CEFAZOLIN SODIUM-DEXTROSE 2-4 GM/100ML-% IV SOLN
2.0000 g | INTRAVENOUS | Status: AC
  Administered 2024-06-21: 2 g via INTRAVENOUS

## 2024-06-21 MED ORDER — MIDAZOLAM HCL 5 MG/5ML IJ SOLN
INTRAMUSCULAR | Status: DC | PRN
  Administered 2024-06-21: 2 mg via INTRAVENOUS

## 2024-06-21 MED ORDER — VANCOMYCIN HCL 1000 MG IV SOLR
INTRAVENOUS | Status: AC
  Filled 2024-06-21: qty 20

## 2024-06-21 MED ORDER — METHOCARBAMOL 1000 MG/10ML IJ SOLN: 500.0000 mg | Freq: Four times a day (QID) | INTRAMUSCULAR | Status: DC | PRN

## 2024-06-21 MED ORDER — KETOROLAC TROMETHAMINE 30 MG/ML IJ SOLN
INTRAMUSCULAR | Status: AC
  Filled 2024-06-21: qty 1

## 2024-06-21 MED ORDER — METHOCARBAMOL 500 MG PO TABS
500.0000 mg | ORAL_TABLET | Freq: Four times a day (QID) | ORAL | Status: DC | PRN
  Administered 2024-06-21 – 2024-06-22 (×3): 500 mg via ORAL
  Filled 2024-06-21 (×4): qty 1

## 2024-06-21 MED ORDER — BUPIVACAINE-MELOXICAM ER 400-12 MG/14ML IJ SOLN
INTRAMUSCULAR | Status: DC | PRN
  Administered 2024-06-21: 400 mg

## 2024-06-21 MED ORDER — ONDANSETRON HCL 4 MG/2ML IJ SOLN: 4.0000 mg | Freq: Once | INTRAMUSCULAR | Status: DC | PRN

## 2024-06-21 MED ORDER — TRANEXAMIC ACID-NACL 1000-0.7 MG/100ML-% IV SOLN
1000.0000 mg | Freq: Once | INTRAVENOUS | Status: AC
  Administered 2024-06-21: 1000 mg via INTRAVENOUS
  Filled 2024-06-21: qty 100

## 2024-06-21 MED ORDER — HYDROMORPHONE HCL 1 MG/ML IJ SOLN
0.2500 mg | INTRAMUSCULAR | Status: DC | PRN
  Administered 2024-06-21: 0.5 mg via INTRAVENOUS

## 2024-06-21 MED ORDER — HYDROMORPHONE HCL 1 MG/ML IJ SOLN: 1.0000 mg | Freq: Three times a day (TID) | INTRAMUSCULAR | Status: DC | PRN

## 2024-06-21 MED ORDER — OXYCODONE HCL 5 MG PO TABS
10.0000 mg | ORAL_TABLET | Freq: Four times a day (QID) | ORAL | Status: DC | PRN
  Administered 2024-06-21: 10 mg via ORAL
  Filled 2024-06-21 (×3): qty 2

## 2024-06-21 MED ORDER — ACETAMINOPHEN 500 MG PO TABS: 1000.0000 mg | ORAL_TABLET | Freq: Once | ORAL | Status: AC

## 2024-06-21 MED ORDER — PHENYLEPHRINE HCL (PRESSORS) 10 MG/ML IV SOLN
INTRAVENOUS | Status: DC | PRN
  Administered 2024-06-21 (×2): 160 ug via INTRAVENOUS

## 2024-06-21 MED ORDER — MUPIROCIN 2 % EX OINT: 1.0000 | TOPICAL_OINTMENT | Freq: Two times a day (BID) | CUTANEOUS | 0 refills | Status: AC

## 2024-06-21 MED ORDER — TRANEXAMIC ACID-NACL 1000-0.7 MG/100ML-% IV SOLN
INTRAVENOUS | Status: AC
  Filled 2024-06-21: qty 100

## 2024-06-21 MED ORDER — BUPIVACAINE IN DEXTROSE 0.75-8.25 % IT SOLN
INTRATHECAL | Status: DC | PRN
  Administered 2024-06-21: 1.8 mL via INTRATHECAL

## 2024-06-21 MED ORDER — SUCCINYLCHOLINE CHLORIDE 200 MG/10ML IV SOSY
PREFILLED_SYRINGE | INTRAVENOUS | Status: AC
  Filled 2024-06-21: qty 10

## 2024-06-21 MED ORDER — AMLODIPINE BESYLATE 5 MG PO TABS
2.5000 mg | ORAL_TABLET | Freq: Every day | ORAL | Status: DC
  Administered 2024-06-21 – 2024-06-22 (×2): 2.5 mg via ORAL
  Filled 2024-06-21 (×2): qty 1

## 2024-06-21 MED ORDER — OXYCODONE HCL 5 MG PO TABS
5.0000 mg | ORAL_TABLET | Freq: Four times a day (QID) | ORAL | Status: DC | PRN
  Administered 2024-06-21 – 2024-06-22 (×2): 5 mg via ORAL
  Filled 2024-06-21: qty 1

## 2024-06-21 MED ORDER — VANCOMYCIN HCL IN DEXTROSE 1-5 GM/200ML-% IV SOLN: 1000.0000 mg | Freq: Once | INTRAVENOUS | Status: AC

## 2024-06-21 MED ORDER — DIPHENHYDRAMINE HCL 50 MG/ML IJ SOLN
INTRAMUSCULAR | Status: AC
  Filled 2024-06-21: qty 2

## 2024-06-21 MED ORDER — ACETAMINOPHEN 500 MG PO TABS
1000.0000 mg | ORAL_TABLET | Freq: Four times a day (QID) | ORAL | Status: AC
  Administered 2024-06-21 – 2024-06-22 (×4): 1000 mg via ORAL
  Filled 2024-06-21 (×4): qty 2

## 2024-06-21 MED ORDER — KETOROLAC TROMETHAMINE 30 MG/ML IJ SOLN
15.0000 mg | Freq: Once | INTRAMUSCULAR | Status: AC | PRN
  Administered 2024-06-21: 15 mg via INTRAVENOUS

## 2024-06-21 MED ORDER — AMISULPRIDE (ANTIEMETIC) 5 MG/2ML IV SOLN: 10.0000 mg | Freq: Once | INTRAVENOUS | Status: DC | PRN

## 2024-06-21 NOTE — H&P (Signed)
 "   PREOPERATIVE H&P  Chief Complaint: right knee osteoarthritis  HPI: Jordan Barajas is a 69 y.o. male who presents for surgical treatment of right knee osteoarthritis.  He denies any changes in medical history.  Past Surgical History:  Procedure Laterality Date   APPENDECTOMY     as a child   TOTAL KNEE ARTHROPLASTY Left 07/19/2019   Procedure: LEFT TOTAL KNEE ARTHROPLASTY;  Surgeon: Jerri Kay HERO, MD;  Location: [facility] OR;  Service: Orthopedics;  Laterality: Left;   Social History   Socioeconomic History   Marital status: Married    Spouse name: Not on file   Number of children: Not on file   Years of education: Not on file   Highest education level: Not on file  Occupational History   Not on file  Tobacco Use   Smoking status: Never   Smokeless tobacco: Never  Vaping Use   Vaping status: Never Used  Substance and Sexual Activity   Alcohol  use: No   Drug use: No   Sexual activity: Not on file  Other Topics Concern   Not on file  Social History Narrative   Not on file   Social Drivers of Health   Tobacco Use: Low Risk (06/14/2024)   Patient History    Smoking Tobacco Use: Never    Smokeless Tobacco Use: Never    Passive Exposure: Not on file  Financial Resource Strain: Not on file  Food Insecurity: Not on file  Transportation Needs: Not on file  Physical Activity: Not on file  Stress: Not on file  Social Connections: Not on file  Depression (EYV7-0): Not on file  Alcohol  Screen: Not on file  Housing: Not on file  Utilities: Not on file  Health Literacy: Not on file   Family History  Problem Relation Age of Onset   Thyroid  disease Neg Hx    Allergies  Allergen Reactions   Fish Allergy Hives and Swelling   Prior to Admission medications  Medication Sig Start Date End Date Taking? Authorizing Provider  amLODipine  (NORVASC ) 2.5 MG tablet Take 2.5 mg by mouth daily. 07/10/19  Yes [provider]  aspirin  (ASPIRIN  CHILDRENS) 81 MG  chewable tablet Chew 1 tablet (81 mg total) by mouth 2 (two) times daily. To be taken after surgery to prevent blood clots 06/17/24 06/17/25  Jule Ronal CROME, PA-C  docusate sodium  (COLACE) 100 MG capsule Take 1 capsule (100 mg total) by mouth daily as needed. 06/17/24 06/17/25  Jule Ronal CROME, PA-C  methocarbamol  (ROBAXIN ) 500 MG tablet Take 1 tablet (500 mg total) by mouth 2 (two) times daily as needed. 06/17/24   Jule Ronal CROME, PA-C  ondansetron  (ZOFRAN ) 4 MG tablet Take 1 tablet (4 mg total) by mouth every 8 (eight) hours as needed for nausea or vomiting. 06/17/24   Jule Ronal CROME, PA-C  oxyCODONE -acetaminophen  (PERCOCET) 5-325 MG tablet Take 1-2 tablets by mouth every 8 (eight) hours as needed. 06/17/24   Jule Ronal CROME, PA-C  SYNTHROID  137 MCG tablet Take 137 mcg by mouth every morning.   Yes [provider]  aspirin  EC 81 MG tablet Take 1 tablet (81 mg total) by mouth 2 (two) times daily. Patient not taking: Reported on 06/11/2024 07/18/19   Jerri Kay HERO, MD  doxycycline  (VIBRA -TABS) 100 MG tablet Take 1 tablet (100 mg total) by mouth 2 (two) times daily. To be taken after surgery 06/14/24   Jule Ronal CROME, PA-C  levothyroxine  (SYNTHROID , LEVOTHROID) 150 MCG tablet Take 1 tablet (  150 mcg total) by mouth daily before breakfast. Patient not taking: Reported on 06/11/2024 05/14/17   Kassie Mallick, MD  methocarbamol  (ROBAXIN ) 750 MG tablet Take 1 tablet (750 mg total) by mouth 2 (two) times daily as needed for muscle spasms. Patient not taking: Reported on 06/11/2024 07/18/19   Jerri Kay HERO, MD  ondansetron  (ZOFRAN ) 4 MG tablet Take 1-2 tablets (4-8 mg total) by mouth every 8 (eight) hours as needed for nausea or vomiting. Patient not taking: Reported on 06/11/2024 07/18/19   Jerri Kay HERO, MD  oxyCODONE  (OXY IR/ROXICODONE ) 5 MG immediate release tablet Take 1-3 tablets (5-15 mg total) by mouth every 8 (eight) hours as needed for severe pain. Patient not taking: Reported on 06/11/2024  08/03/19   Jerri Kay HERO, MD  oxyCODONE  (OXY IR/ROXICODONE ) 5 MG immediate release tablet Take 1 tablet (5 mg total) by mouth daily as needed for severe pain. Patient not taking: Reported on 06/11/2024 08/31/19   Jerri Kay HERO, MD  sulfamethoxazole -trimethoprim  (BACTRIM  DS) 800-160 MG tablet Take 1 tablet by mouth 2 (two) times daily. Patient not taking: Reported on 06/11/2024 07/18/19   Jerri Kay HERO, MD     Positive ROS: All other systems have been reviewed and were otherwise negative with the exception of those mentioned in the HPI and as above.  Physical Exam: General: Alert, no acute distress Cardiovascular: No pedal edema Respiratory: No cyanosis, no use of accessory musculature GI: abdomen soft Skin: No lesions in the area of chief complaint Neurologic: Sensation intact distally Psychiatric: Patient is competent for consent with normal mood and affect Lymphatic: no lymphedema  MUSCULOSKELETAL: exam stable  Assessment: right knee osteoarthritis  Plan: Plan for Procedures: ARTHROPLASTY, KNEE, TOTAL  The risks benefits and alternatives were discussed with the patient including but not limited to the risks of nonoperative treatment, versus surgical intervention including infection, bleeding, nerve injury,  blood clots, cardiopulmonary complications, morbidity, mortality, among others, and they were willing to proceed.   Ozell Jerri, MD 06/21/2024 5:51 AM  "

## 2024-06-21 NOTE — Discharge Instructions (Signed)

## 2024-06-21 NOTE — Anesthesia Postprocedure Evaluation (Signed)
"   Anesthesia Post Note  Patient: Jordan Barajas  Procedure(s) Performed: ARTHROPLASTY, KNEE, TOTAL (Right: Knee)     Patient location during evaluation: PACU Anesthesia Type: Regional, Spinal and MAC Level of consciousness: awake and alert and oriented Pain management: pain level controlled Vital Signs Assessment: post-procedure vital signs reviewed and stable Respiratory status: spontaneous breathing, nonlabored ventilation and respiratory function stable Cardiovascular status: blood pressure returned to baseline and stable Postop Assessment: no headache, no backache, spinal receding and patient able to bend at knees Anesthetic complications: no   No notable events documented.  Last Vitals:  Vitals:   06/21/24 1115 06/21/24 1130  BP: (!) 133/99 (!) 125/96  Pulse: 72 70  Resp: 17 12  Temp:  (!) 36.2 C  SpO2: 96% 95%    Last Pain:  Vitals:   06/21/24 1113  TempSrc:   PainSc: 5                  [name]      "

## 2024-06-21 NOTE — Evaluation (Signed)
 Physical Therapy Evaluation Patient Details Name: Jordan Barajas MRN: [id number] DOB: [birth year] Today's Date: 06/21/2024  History of Present Illness  Pt is 69 year old presented to [facility] on  06/21/24 for rt tkr. PMH - lt tkr, htn, sleep apnea, arthritis  Clinical Impression  Pt doing well post op day #0 and expect continued good progress with mobility and with rt knee strength, ROM, and function. Pt plans to return home with wife.         If plan is discharge home, recommend the following: Help with stairs or ramp for entrance;Assist for transportation;Assistance with cooking/housework   Can travel by private vehicle        Equipment Recommendations None recommended by PT  Recommendations for Other Services       Functional Status Assessment Patient has had a recent decline in their functional status and demonstrates the ability to make significant improvements in function in a reasonable and predictable amount of time.     Precautions / Restrictions Precautions Precautions: Knee Restrictions Weight Bearing Restrictions Per Provider Order: Yes RLE Weight Bearing Per Provider Order: Weight bearing as tolerated      Mobility  Bed Mobility Overal bed mobility: Needs Assistance Bed Mobility: Supine to Sit     Supine to sit: Supervision          Transfers Overall transfer level: Needs assistance Equipment used: Rolling walker (2 wheels) Transfers: Sit to/from Stand Sit to Stand: Min assist           General transfer comment: Assist to power up    Ambulation/Gait Ambulation/Gait assistance: Contact guard assist Gait Distance (Feet): 200 Feet Assistive device: Rolling walker (2 wheels) Gait Pattern/deviations: Step-through pattern, Decreased stride length Gait velocity: decr Gait velocity interpretation: 1.31 - 2.62 ft/sec, indicative of limited community ambulator   General Gait Details: Assist for safety  Stairs            Wheelchair Mobility      Tilt Bed    Modified Rankin (Stroke Patients Only)       Balance Overall balance assessment: No apparent balance deficits (not formally assessed)                                           Pertinent Vitals/Pain Pain Assessment Pain Assessment: Faces Faces Pain Scale: Hurts little more Pain Location: rt knee Pain Descriptors / Indicators: Aching, Guarding, Grimacing Pain Intervention(s): Limited activity within patient's tolerance, Monitored during session    Home Living Family/patient expects to be discharged to:: Private residence Living Arrangements: Spouse/significant other Available Help at Discharge: Family;Available 24 hours/day Type of Home: House Home Access: Stairs to enter Entrance Stairs-Rails: Right;Left Entrance Stairs-Number of Steps: 3 Alternate Level Stairs-Number of Steps: flight Home Layout: Two level;Bed/bath upstairs;Able to live on main level with bedroom/bathroom Home Equipment: Rolling Walker (2 wheels);Cane - single point      Prior Function Prior Level of Function : Independent/Modified Independent;Working/employed;Driving             Mobility Comments: No assistive device       Extremity/Trunk Assessment   Upper Extremity Assessment Upper Extremity Assessment: Defer to OT evaluation    Lower Extremity Assessment Lower Extremity Assessment: RLE deficits/detail RLE Deficits / Details: Good quad set, able to straight leg raise. Knee AAROM 0-90       Communication   Communication Communication: No apparent difficulties  Cognition Arousal: Alert Behavior During Therapy: WFL for tasks assessed/performed   PT - Cognitive impairments: No apparent impairments                         Following commands: Intact       Cueing Cueing Techniques: Verbal cues     General Comments      Exercises Total Joint Exercises Quad Sets: AROM, Right, 5 reps, Supine Heel Slides: AAROM, Right, 5 reps,  Supine Straight Leg Raises: AAROM, Right, 5 reps, Supine Long Arc Quad: AROM, Right, 5 reps, Seated Knee Flexion: AAROM, Right, 5 reps, Seated Goniometric ROM: 0-90   Assessment/Plan    PT Assessment Patient needs continued PT services  PT Problem List Decreased strength;Decreased range of motion;Decreased mobility;Pain       PT Treatment Interventions DME instruction;Gait training;Stair training;Functional mobility training;Therapeutic activities;Therapeutic exercise;Patient/family education    PT Goals (Current goals can be found in the Care Plan section)  Acute Rehab PT Goals Patient Stated Goal: return home PT Goal Formulation: With patient Time For Goal Achievement: 06/25/24 Potential to Achieve Goals: Good    Frequency 7X/week     Co-evaluation               AM-PAC PT 6 Clicks Mobility  Outcome Measure Help needed turning from your back to your side while in a flat bed without using bedrails?: None Help needed moving from lying on your back to sitting on the side of a flat bed without using bedrails?: A Little Help needed moving to and from a bed to a chair (including a wheelchair)?: A Little Help needed standing up from a chair using your arms (e.g., wheelchair or bedside chair)?: A Little Help needed to walk in hospital room?: A Little Help needed climbing 3-5 steps with a railing? : A Little 6 Click Score: 19    End of Session Equipment Utilized During Treatment: Gait belt Activity Tolerance: Patient tolerated treatment well Patient left: in chair;with call bell/phone within reach;with family/visitor present Nurse Communication: Mobility status PT Visit Diagnosis: Other abnormalities of gait and mobility (R26.89);Pain Pain - Right/Left: Right Pain - part of body: Knee    Time: 1345-1405 PT Time Calculation (min) (ACUTE ONLY): 20 min   Charges:   PT Evaluation $PT Eval Low Complexity: 1 Low   PT General Charges $$ ACUTE PT VISIT: 1 Visit          [facility] PT Acute Rehabilitation Services Office [phone]   Rodgers ORN [facility] 06/21/2024, 2:52 PM

## 2024-06-21 NOTE — Anesthesia Procedure Notes (Signed)
 Spinal  Patient location during procedure: OR Start time: 06/21/2024 7:54 AM End time: 06/21/2024 7:58 AM Reason for block: surgical anesthesia  Staffing Performed: anesthesiologist  Authorized by: Merla Almarie HERO, DO   Performed by: Merla Almarie HERO, DO  Preanesthetic Checklist Completed: patient identified, IV checked, risks and benefits discussed, surgical consent, monitors and equipment checked, pre-op evaluation and timeout performed Spinal Block Patient position: sitting Prep: DuraPrep and site prepped and draped Patient monitoring: cardiac monitor, continuous pulse ox and blood pressure Approach: midline Location: L3-4 Injection technique: single-shot Needle Needle type: Pencan  Needle gauge: 24 G Needle length: 9 cm Assessment Sensory level: T6 Events: CSF return  Additional Notes Functioning IV was confirmed and monitors were applied. Sterile prep and drape, including hand hygiene and sterile gloves were used. The patient was positioned and the spine was prepped. The skin was anesthetized with lidocaine .  Free flow of clear CSF was obtained prior to injecting local anesthetic into the CSF.  The spinal needle aspirated freely following injection.  The needle was carefully withdrawn.  The patient tolerated the procedure well.

## 2024-06-21 NOTE — Transfer of Care (Signed)
 Immediate Anesthesia Transfer of Care Note  Patient: Jordan Barajas  Procedure(s) Performed: ARTHROPLASTY, KNEE, TOTAL (Right: Knee)  Patient Location: PACU  Anesthesia Type:MAC  Level of Consciousness: drowsy  Airway & Oxygen Therapy: Patient Spontanous Breathing and Patient connected to nasal cannula oxygen  Post-op Assessment: Report given to RN and Post -op Vital signs reviewed and stable  Post vital signs: stable  Last Vitals:  Vitals Value Taken Time  BP 119/81 06/21/24 10:00  Temp 36.5 C 06/21/24 09:50  Pulse 79 06/21/24 10:00  Resp 12 06/21/24 10:00  SpO2 93 % 06/21/24 10:00  Vitals shown include unfiled device data.  Last Pain:  Vitals:   06/21/24 0623  TempSrc:   PainSc: 0-No pain         Complications: No notable events documented.

## 2024-06-21 NOTE — Progress Notes (Signed)
 Orthopedic Tech Progress Note Patient Details:  Jordan Barajas 17-Jun-1955 [id number]  Ortho Devices Type of Ortho Device: Bone foam zero knee Ortho Device/Splint Location: RLE Ortho Device/Splint Interventions: Ordered, Application, Adjustment   Post Interventions Patient Tolerated: Well Instructions Provided: Care of device  Delanna LITTIE Pac 06/21/2024, 12:20 PM

## 2024-06-21 NOTE — Op Note (Signed)
 "  Total Knee Arthroplasty Procedure Note  Preoperative diagnosis: Right knee osteoarthritis  Postoperative diagnosis:same  Operative findings: Complete loss of articular cartilage from all 3 compartments Good bone quality  Operative procedure: Right total knee arthroplasty. CPT [phone]  Surgeon: N. Ozell Cummins, MD  Assist: Ronal Morna Grave, PA-C; necessary for the timely completion of procedure and due to complexity of procedure.  Anesthesia: Spinal, regional  Tourniquet time: see anesthesia record  Implants: Zimmer persona press fit Femur: CR 8 Tibia: F Patella: 35 mm Polyethylene: 14 mm medial congruent  Indication: Jordan Barajas is a 69 y.o. year old male with a history of knee pain. Having failed conservative management, the patient elected to proceed with a total knee arthroplasty.  We have reviewed the risk and benefits of the surgery and they elected to proceed after voicing understanding.  Procedure:  After informed consent was obtained and understanding of the risk were voiced including but not limited to bleeding, infection, damage to surrounding structures including nerves and vessels, blood clots, leg length inequality and the failure to achieve desired results, the operative extremity was marked with verbal confirmation of the patient in the holding area.   The patient was then brought to the operating room and transported to the operating room table in the supine position.  A tourniquet was applied to the operative extremity around the upper thigh. The operative limb was then prepped and draped in the usual sterile fashion and preoperative antibiotics were administered.  A time out was performed prior to the start of surgery confirming the correct extremity, preoperative antibiotic administration, as well as team members, implants and instruments available for the case. Correct surgical site was also confirmed with preoperative radiographs. The limb was then  elevated for exsanguination and the tourniquet was inflated. A midline incision was made and a standard medial parapatellar approach was performed.  The patella was everted which showed complete loss of articular cartilage.  The patella was resected down to 16 mm and sized to a 35 mm.  A cover was placed on the patella for protection from retractors.  We then turned our attention to the femur.  The ACL was sacrificed. Start site was drilled in the femur and the intramedullary distal femoral cutting guide was placed, set at 5 degrees valgus, taking 10 mm of distal resection. The distal cut was made. Osteophytes were then removed.   Next, the proximal tibial cutting guide was placed with appropriate slope, varus/valgus alignment and depth of resection. A drop rod was attached to confirm that it was pointed to the second metatarsal.  The proximal tibial cut was made taking 4 mm off the low side. Gap blocks were then used to assess the extension gap and alignment, and appropriate soft tissue releases were performed. Attention was turned back to the femur, which was sized using the sizing guide to a size 8 standard. Appropriate rotation of the femoral component was determined using epicondylar axis, Whitesides line, and assessing the flexion gap under ligament tension. The appropriate size 4-in-1 cutting block was placed and cuts were made.  Posterior femoral osteophytes and uncapped bone were then removed with the curved osteotome.  Trial components were placed, and stability was checked in full extension, mid-flexion, and deep flexion.  I found that a 14 mm trial insert was appropriate.  The PCL was resected to balance the flexion space.  The patella tracked well without a lateral release. Trial components were then removed and tibial preparation performed.  The tibial  trial was pointed to the medial third of the tibial tubercle.  The tibia was sized for a size F component and prepped.  Trial components were  removed.   The bony surfaces were irrigated with a pulse lavage and then dried. Final components were placed.  The final polyethylene liner, 14 mm thick, was inserted and checked to ensure the locking mechanism had engaged appropriately.  The stability of the construct was re-evaluated throughout a range of motion and found to be acceptable.  The tourniquet was deflated and hemostasis was achieved. The wound was irrigated with normal saline.  One gram of vancomycin  powder was placed in the surgical bed.  Topical mixture of 0.25% bupivacaine  and meloxicam  was placed in the joint for postoperative pain.  Capsular closure was performed with a #1 statafix in flexion, subcutaneous fat closed with a 0 vicryl suture, then subcutaneous tissue closed with interrupted 2.0 vicryl suture. The skin was then closed with a 2.0 nylon and dermabond. A sterile dressing was applied.  The patient was awakened in the operating room and taken to recovery in stable condition. All sponge, needle, and instrument counts were correct at the end of the case.  Morna Grave was necessary for opening, closing, retracting, limb positioning and overall facilitation and completion of the surgery.  Position: supine  Complications: none.  Time Out: performed  Drains/Packing: none Estimated blood loss: minimal Returned to Recovery Room: in good condition.   Mechanical VTE (DVT) Prophylaxis: sequential compression devices, TED thigh-high  Chemical VTE (DVT) Prophylaxis: eliquis  POD 1  Fluid Replacement  Crystalloid: see anesthesia record Blood: none  FFP: none   Specimens Removed: 1 to pathology  Sponge and Instrument Count Correct? yes  PACU: portable radiograph - knee AP and Lateral  Plan/RTC: Return in 2 weeks for suture removal.  Weight Bearing/Load Lower Extremity: full   Implant Name Type Inv. Item Serial No. Manufacturer Lot No. LRB No. Used Action  COMPONENT FEM STD PS 8 RT - [id number] Joint COMPONENT FEM STD PS 8  RT  [employer](ORTH,TRAU,BIO,SG) [id number] Right 1 Implanted  PROSTHESIS TIB KNEE PS 0D F RT - [id number] Joint PROSTHESIS TIB KNEE PS 0D F RT  [employer]MER [employer]ON(ORTH,TRAU,BIO,SG) [id number] Right 1 Implanted  COMPONENT PATELLA 3 PEG 35 - [id number] Joint COMPONENT PATELLA 3 PEG 35  ZIMMER [employer]ON(ORTH,TRAU,BIO,SG) [id number] Right 1 Implanted  INSERT TIB ASF SZ8-11 14 RT - [id number] Insert INSERT TIB ASF SZ8-11 14 RT  [employer]MER [employer]ON(ORTH,TRAU,BIO,SG) [id number] Right 1 Implanted    N. Ozell Cummins, MD [facility] 9:15 AM  "

## 2024-06-21 NOTE — Progress Notes (Signed)
 Pharmacy Antibiotic Note  Jordan Barajas is a 69 y.o. male admitted on 06/21/2024 with surgical prophylaxis.  Pharmacy has been consulted for vanco dosing.  Plan: Vancomycin  1000 mg IV every 12 hourseAUC 504 / Tss 15 / scr 1.21  Height: 5' 9 (175.3 cm) Weight: 101.6 kg (224 lb) IBW/kg (Calculated) : 70.7  Temp (24hrs), Avg:97.7 F (36.5 C), Min:97.2 F (36.2 C), Max:98.2 F (36.8 C)  No results for input(s): WBC, CREATININE, LATICACIDVEN, VANCOTROUGH, VANCOPEAK, VANCORANDOM, GENTTROUGH, GENTPEAK, GENTRANDOM, TOBRATROUGH, TOBRAPEAK, TOBRARND, AMIKACINPEAK, AMIKACINTROU, AMIKACIN in the last 168 hours.  Estimated Creatinine Clearance: 70.6 mL/min (by C-G formula based on SCr of 1.21 mg/dL).    Allergies[1]     Thank you for allowing pharmacy to be a part of this patients care.  Benedetta Heath BS, PharmD, BCPS Clinical Pharmacist 06/21/2024 12:22 PM  Contact: [phone] after 3 PM    [1]  Allergies Allergen Reactions   Fish Allergy Hives and Swelling

## 2024-06-21 NOTE — Anesthesia Procedure Notes (Signed)
 Anesthesia Regional Block: Adductor canal block   Pre-Anesthetic Checklist: , timeout performed,  Correct Patient, Correct Site, Correct Laterality,  Correct Procedure, Correct Position, site marked,  Risks and benefits discussed,  Surgical consent,  Pre-op evaluation,  At surgeon's request and post-op pain management  Laterality: Right  Prep: Maximum Sterile Barrier Precautions used, chloraprep       Needles:  Injection technique: Single-shot  Needle Type: Echogenic Stimulator Needle     Needle Length: 9cm  Needle Gauge: 22     Additional Needles:   Procedures:,,,, ultrasound used (permanent image in chart),,    Narrative:  Start time: 06/21/2024 7:20 AM End time: 06/21/2024 7:25 AM Injection made incrementally with aspirations every 5 mL.  Performed by: Personally  Anesthesiologist: Merla Almarie HERO, DO  Additional Notes: Monitors applied. No increased pain on injection. No increased resistance to injection. Injection made in 5cc increments. Good needle visualization. Patient tolerated procedure well.

## 2024-06-22 ENCOUNTER — Encounter (HOSPITAL_COMMUNITY): Payer: Self-pay | Admitting: Orthopaedic Surgery

## 2024-06-22 DIAGNOSIS — M1711 Unilateral primary osteoarthritis, right knee: Secondary | ICD-10-CM | POA: Diagnosis not present

## 2024-06-22 MED ORDER — DEXAMETHASONE SOD PHOSPHATE PF 10 MG/ML IJ SOLN
10.0000 mg | Freq: Once | INTRAMUSCULAR | Status: AC
  Administered 2024-06-22: 10 mg via INTRAVENOUS
  Filled 2024-06-22: qty 1

## 2024-06-22 NOTE — Progress Notes (Signed)
 Physical Therapy Treatment Patient Details Name: Jordan Barajas MRN: [id number] DOB: [birth year] Today's Date: 06/22/2024   History of Present Illness Pt is 69 year old presented to [facility] on  06/21/24 for rt tkr. PMH - lt tkr, htn, sleep apnea, arthritis    PT Comments  Patient doing very well POD1. Transfers modified independent with RW. Ambulates with RW and supervision for cues for proximity to RW. Stairs (flight) with CGA and rails. Educated in all exercises with pt doing AROM for each. Knee ROM 0-90. Patient ok for discharge from PT perspective. No second session needed.     If plan is discharge home, recommend the following: Help with stairs or ramp for entrance;Assist for transportation;Assistance with cooking/housework   Can travel by private vehicle        Equipment Recommendations  None recommended by PT    Recommendations for Other Services       Precautions / Restrictions Precautions Precautions: Knee Recall of Precautions/Restrictions: Intact Restrictions Weight Bearing Restrictions Per Provider Order: Yes RLE Weight Bearing Per Provider Order: Weight bearing as tolerated     Mobility  Bed Mobility               General bed mobility comments: OOB in chair    Transfers Overall transfer level: Needs assistance Equipment used: Rolling walker (2 wheels) Transfers: Sit to/from Stand Sit to Stand: Modified independent (Device/Increase time)                Ambulation/Gait Ambulation/Gait assistance: Supervision Gait Distance (Feet): 150 Feet Assistive device: Rolling walker (2 wheels) Gait Pattern/deviations: Step-through pattern, Decreased stride length Gait velocity: decr     General Gait Details: cues for proximity to RW   Stairs Stairs: Yes Stairs assistance: Contact guard assist Stair Management: Two rails, Step to pattern, Forwards Number of Stairs: 10 General stair comments: pt recalled proper sequencing from prior  surgery   Wheelchair Mobility     Tilt Bed    Modified Rankin (Stroke Patients Only)       Balance Overall balance assessment: No apparent balance deficits (not formally assessed)                                          Communication Communication Communication: No apparent difficulties  Cognition Arousal: Alert Behavior During Therapy: WFL for tasks assessed/performed                             Following commands: Intact      Cueing Cueing Techniques: Verbal cues  Exercises Total Joint Exercises Ankle Circles/Pumps: AROM, Both, 10 reps Quad Sets: AROM, Right, Supine, 10 reps Short Arc Quad: AROM, Right, 10 reps Heel Slides: AAROM, Right, 5 reps, Supine Hip ABduction/ADduction: AROM, Right, 10 reps Straight Leg Raises: Right, Supine, AROM, 10 reps Long Arc Quad: AROM, Right, 5 reps, Seated Knee Flexion: AAROM, Right, 5 reps, Seated Goniometric ROM: 0-90    General Comments        Pertinent Vitals/Pain Pain Assessment Pain Assessment: 0-10 Pain Score: 5  Pain Location: rt knee Pain Descriptors / Indicators: Aching, Guarding, Grimacing Pain Intervention(s): Limited activity within patient's tolerance, Monitored during session, Patient requesting pain meds-RN notified    Home Living Family/patient expects to be discharged to:: Private residence Living Arrangements: Spouse/significant other Available Help at Discharge: Family;Available 24 hours/day Type of Home: House  Home Access: Stairs to enter Entrance Stairs-Rails: Right;Left Entrance Stairs-Number of Steps: 3 Alternate Level Stairs-Number of Steps: flight Home Layout: Two level;Bed/bath upstairs;Able to live on main level with bedroom/bathroom Home Equipment: Rolling Walker (2 wheels);Cane - single point      Prior Function            PT Goals (current goals can now be found in the care plan section) Acute Rehab PT Goals Patient Stated Goal: return home Time For  Goal Achievement: 06/25/24 Potential to Achieve Goals: Good Progress towards PT goals: Progressing toward goals    Frequency    7X/week      PT Plan      Co-evaluation              AM-PAC PT 6 Clicks Mobility   Outcome Measure  Help needed turning from your back to your side while in a flat bed without using bedrails?: None Help needed moving from lying on your back to sitting on the side of a flat bed without using bedrails?: A Little Help needed moving to and from a bed to a chair (including a wheelchair)?: None Help needed standing up from a chair using your arms (e.g., wheelchair or bedside chair)?: None Help needed to walk in hospital room?: A Little Help needed climbing 3-5 steps with a railing? : A Little 6 Click Score: 21    End of Session Equipment Utilized During Treatment: Gait belt Activity Tolerance: Patient tolerated treatment well Patient left: in chair;with call bell/phone within reach;with family/visitor present Nurse Communication: Mobility status;Patient requests pain meds PT Visit Diagnosis: Other abnormalities of gait and mobility (R26.89);Pain Pain - Right/Left: Right Pain - part of body: Knee     Time: 9190-9169 PT Time Calculation (min) (ACUTE ONLY): 21 min  Charges:    $Therapeutic Exercise: 8-22 mins PT General Charges $$ ACUTE PT VISIT: 1 Visit                      Macario RAMAN, PT Acute Rehabilitation Services  Office [phone]    Macario SHAUNNA Soja 06/22/2024, 9:08 AM

## 2024-06-22 NOTE — Care Management (Signed)
 Patient with order to DC to home today. Unit staff to provide DME needed for home.   Patient set up from the office prior to admission with [facility] services through [facility].    Liaison for agency has been notified of DC. Information added to AVS  Patient will have family/ friends provide transportation home. No other TOC needs identified for DCPatient with order to DC to home today.

## 2024-06-22 NOTE — Progress Notes (Signed)
 Patient alert and oriented, voided, ambulated.d/c instructions explain and given to the patient, all questions answered. Surgical dressing with small drain, dressing changed per order, no sign of infection.

## 2024-06-22 NOTE — Evaluation (Signed)
 Occupational Therapy Evaluation Patient Details Name: Jordan Barajas MRN: [id number] DOB: [birth year] Today's Date: 06/22/2024   History of Present Illness   Pt is 69 year old presented to [facility] on  06/21/24 for rt tkr. PMH - lt tkr, htn, sleep apnea, arthritis     Clinical Impressions PTA, pt living with spouse and reports being independent in BADL and IADL. Upon eval, pt needing up to supervision for BADL. Provided education regarding compensatory techniques for LB ADL, shower transfers within precautions. All questions answered. No further acute needs identified. OT to sign off. Thank you for this order; please re-consult if change in status.      If plan is discharge home, recommend the following:   Assistance with cooking/housework;Assist for transportation;Help with stairs or ramp for entrance     Functional Status Assessment   Patient has had a recent decline in their functional status and demonstrates the ability to make significant improvements in function in a reasonable and predictable amount of time.     Equipment Recommendations   None recommended by OT     Recommendations for Other Services         Precautions/Restrictions   Precautions Precautions: Knee Restrictions Weight Bearing Restrictions Per Provider Order: Yes RLE Weight Bearing Per Provider Order: Weight bearing as tolerated     Mobility Bed Mobility               General bed mobility comments: OOB in chair    Transfers Overall transfer level: Needs assistance Equipment used: Rolling walker (2 wheels) Transfers: Sit to/from Stand Sit to Stand: Supervision                  Balance Overall balance assessment: No apparent balance deficits (not formally assessed)                                         ADL either performed or assessed with clinical judgement   ADL Overall ADL's : Needs assistance/impaired Eating/Feeding: Independent;Sitting   Grooming:  Supervision/safety;Standing   Upper Body Bathing: Independent;Sitting   Lower Body Bathing: Supervison/ safety;Sit to/from stand   Upper Body Dressing : Independent;Sitting   Lower Body Dressing: Supervision/safety;Sit to/from stand;Cueing for compensatory techniques   Toilet Transfer: Supervision/safety;Ambulation;Rolling walker (2 wheels)   Toileting- Clothing Manipulation and Hygiene: Supervision/safety;Sit to/from stand   Tub/ Shower Transfer: Walk-in shower;Supervision/safety;Ambulation;Rolling walker (2 wheels)   Functional mobility during ADLs: Supervision/safety;Rolling walker (2 wheels)       Vision Patient Visual Report: No change from baseline Vision Assessment?: No apparent visual deficits     Perception Perception: Within Functional Limits       Praxis Praxis: WFL       Pertinent Vitals/Pain Pain Assessment Pain Assessment: Faces Faces Pain Scale: Hurts little more Pain Location: rt knee Pain Descriptors / Indicators: Aching, Guarding, Grimacing Pain Intervention(s): Monitored during session     Extremity/Trunk Assessment Upper Extremity Assessment Upper Extremity Assessment: Overall WFL for tasks assessed   Lower Extremity Assessment Lower Extremity Assessment: Defer to PT evaluation   Cervical / Trunk Assessment Cervical / Trunk Assessment: Normal   Communication Communication Communication: No apparent difficulties   Cognition Arousal: Alert Behavior During Therapy: WFL for tasks assessed/performed Cognition: No apparent impairments  Following commands: Intact       Cueing  General Comments   Cueing Techniques: Verbal cues      Exercises     Shoulder Instructions      Home Living Family/patient expects to be discharged to:: Private residence Living Arrangements: Spouse/significant other Available Help at Discharge: Family;Available 24 hours/day Type of Home: House Home Access:  Stairs to enter [employer] of Steps: 3 Entrance Stairs-Rails: Right;Left Home Layout: Two level;Bed/bath upstairs;Able to live on main level with bedroom/bathroom Alternate Level Stairs-Number of Steps: flight Alternate Level Stairs-Rails: Right;Left Bathroom Shower/Tub: Tub/shower unit         Home Equipment: [employer] (2 wheels);Cane - single point          Prior Functioning/Environment Prior Level of Function : Independent/Modified Independent;Working/employed;Driving             Mobility Comments: No assistive device ADLs Comments: pt reports independent    OT Problem List: Decreased range of motion;Decreased strength;Pain   OT Treatment/Interventions:        OT Goals(Current goals can be found in the care plan section)   Acute Rehab OT Goals Patient Stated Goal: get better OT Goal Formulation: With patient Time For Goal Achievement: 07/06/24 Potential to Achieve Goals: Good   OT Frequency:       Co-evaluation              AM-PAC OT 6 Clicks Daily Activity     Outcome Measure Help from another person eating meals?: None Help from another person taking care of personal grooming?: None Help from another person toileting, which includes using toliet, bedpan, or urinal?: A Little Help from another person bathing (including washing, rinsing, drying)?: A Little Help from another person to put on and taking off regular upper body clothing?: None Help from another person to put on and taking off regular lower body clothing?: A Little 6 Click Score: 21   End of Session Equipment Utilized During Treatment: Rolling walker (2 wheels) Nurse Communication: Mobility status  Activity Tolerance: Patient tolerated treatment well Patient left: in chair;with call bell/phone within reach;with family/visitor present  OT Visit Diagnosis: Muscle weakness (generalized) (M62.81);Pain                Time: 0750-0806 OT Time Calculation (min): 16  min Charges:  OT General Charges $OT Visit: 1 Visit OT Evaluation $OT Eval Low Complexity: 1 Low  Jordan Barajas, OTR/L [facility] Acute Rehabilitation Office: [phone]   Jordan JONETTA Lebron 06/22/2024, 8:50 AM

## 2024-06-22 NOTE — Progress Notes (Addendum)
 Subjective: 1 Day Post-Op Procedures (LRB): ARTHROPLASTY, KNEE, TOTAL (Right) Patient reports pain as mild.    Objective: Vital signs in last 24 hours: Temp:  [97.2 F (36.2 C)-98.6 F (37 C)] 97.8 F (36.6 C) (01/27 0741) Pulse Rate:  [56-87] 64 (01/27 0741) Resp:  [11-20] 19 (01/27 0741) BP: (103-135)/(71-103) 129/82 (01/27 0741) SpO2:  [93 %-100 %] 100 % (01/27 0741)  Intake/Output from previous day: 01/26 0701 - 01/27 0700 In: 2380 [P.O.:1680; I.V.:500; IV Piggyback:200] Out: 705 [Urine:700; Blood:5] Intake/Output this shift: No intake/output data recorded.  No results for input(s): HGB in the last 72 hours. No results for input(s): WBC, RBC, HCT, PLT in the last 72 hours. No results for input(s): NA, K, CL, CO2, BUN, CREATININE, GLUCOSE, CALCIUM in the last 72 hours. No results for input(s): LABPT, INR in the last 72 hours.  Neurologically intact Neurovascular intact Sensation intact distally Intact pulses distally Dorsiflexion/Plantar flexion intact Incision: scant drainage No cellulitis present Compartment soft   Assessment/Plan: 1 Day Post-Op Procedures (LRB): ARTHROPLASTY, KNEE, TOTAL (Right) Advance diet Up with therapy D/C IV fluids Discharge home with home health once cleared by PT WBAT RLE Nurse to change bandage prior to discharge       Ronal LITTIE Grave 06/22/2024, 9:07 AM

## 2024-06-22 NOTE — Discharge Summary (Signed)
 Patient ID: Jordan Barajas MRN: [id number] DOB/AGE: 67-Sep-1957 68 y.o.  Admit date: 06/21/2024 Discharge date: 06/22/2024  Admission Diagnoses:  Principal Problem:   Primary osteoarthritis of right knee Active Problems:   Status post total right knee replacement   Discharge Diagnoses:  Same  Past Medical History:  Diagnosis Date   Arthritis    Hypertension    Hypothyroidism    Sleep apnea     Surgeries: Procedures: ARTHROPLASTY, KNEE, TOTAL on 06/21/2024   Consultants:   Discharged Condition: Improved  Hospital Course: Jordan Barajas is an 69 y.o. male who was admitted 06/21/2024 for operative treatment ofPrimary osteoarthritis of right knee. Patient has severe unremitting pain that affects sleep, daily activities, and work/hobbies. After pre-op clearance the patient was taken to the operating room on 06/21/2024 and underwent  Procedures: ARTHROPLASTY, KNEE, TOTAL.    Patient was given perioperative antibiotics:  Anti-infectives (From admission, onward)    Start     Dose/Rate Route Frequency Ordered Stop   06/21/24 1830  vancomycin  (VANCOCIN ) IVPB 1000 mg/200 mL premix        1,000 mg 200 mL/hr over 60 Minutes Intravenous Every 12 hours 06/21/24 1221 06/22/24 0835   06/21/24 1400  ceFAZolin  (ANCEF ) IVPB 2g/100 mL premix        2 g 200 mL/hr over 30 Minutes Intravenous Every 6 hours 06/21/24 1208 06/22/24 0829   06/21/24 0820  vancomycin  (VANCOCIN ) powder  Status:  Discontinued          As needed 06/21/24 0840 06/21/24 0948   06/21/24 0600  ceFAZolin  (ANCEF ) IVPB 2g/100 mL premix        2 g 200 mL/hr over 30 Minutes Intravenous On call to O.R. 06/21/24 0550 06/21/24 0808   06/21/24 0600  vancomycin  (VANCOCIN ) IVPB 1000 mg/200 mL premix        1,000 mg 200 mL/hr over 60 Minutes Intravenous  Once 06/21/24 0551 06/21/24 0725   06/21/24 0557  ceFAZolin  (ANCEF ) 2-4 GM/100ML-% IVPB       Note to Pharmacy: Jonda Yancy HERO: cabinet override      06/21/24 0557 06/21/24 0808         Patient was given sequential compression devices, early ambulation, and chemoprophylaxis to prevent DVT.  Inpatient Morphine Milligram Equivalents Per Day 1/26 - 1/27   Values displayed are in units of MME/Day    Order Start / End Date Yesterday Today    oxyCODONE  (Oxy IR/ROXICODONE ) immediate release tablet 5 mg 1/26 - 1/26 0 of Unknown --    oxyCODONE  (ROXICODONE ) 5 MG/5ML solution 5 mg 1/26 - 1/26 0 of Unknown --      Group total: 0 of Unknown     HYDROmorphone  (DILAUDID ) injection 0.25-0.5 mg 1/26 - 1/26 10 of 40-80 --    fentaNYL  citrate (PF) (SUBLIMAZE ) injection 1/26 - 1/26 *60 of 60 --    oxyCODONE  (Oxy IR/ROXICODONE ) immediate release tablet 5 mg 1/26 - No end date 7.5 of 15 7.5 of 30    oxyCODONE  (Oxy IR/ROXICODONE ) immediate release tablet 10 mg 1/26 - No end date 15 of 30 0 of 60    HYDROmorphone  (DILAUDID ) injection 1 mg 1/26 - No end date 0 of 40 0 of 60    Daily Totals  * 92.5 of Unknown (at least 185-225) 7.5 of 150  *One-Step medication  Calculation Errors     Order Type Date Details   oxyCODONE  (Oxy IR/ROXICODONE ) immediate release tablet 5 mg Ordered Dose -- Insufficient frequency information   oxyCODONE  (  ROXICODONE ) 5 MG/5ML solution 5 mg Ordered Dose -- Insufficient frequency information            Patient benefited maximally from hospital stay and there were no complications.    Recent vital signs: Patient Vitals for the past 24 hrs:  BP Temp Temp src Pulse Resp SpO2  06/22/24 0741 129/82 97.8 F (36.6 C) Oral 64 19 100 %  06/22/24 0300 106/75 98 F (36.7 C) Oral 61 20 100 %  06/21/24 2241 133/87 97.9 F (36.6 C) Oral 65 20 98 %  06/21/24 1934 134/79 98 F (36.7 C) Oral 71 18 100 %  06/21/24 1705 (!) 135/96 98.6 F (37 C) Oral 63 19 99 %  06/21/24 1212 (!) 129/103 97.8 F (36.6 C) -- (!) 56 17 98 %  06/21/24 1130 (!) 125/96 (!) 97.2 F (36.2 C) -- 70 12 95 %  06/21/24 1115 (!) 133/99 -- -- 72 17 96 %  06/21/24 1100 (!) 132/93 -- -- 81 15 93 %   06/21/24 1045 (!) 120/95 -- -- 67 13 95 %  06/21/24 1030 (!) 128/93 -- -- 70 11 93 %  06/21/24 1015 117/84 -- -- 80 13 94 %  06/21/24 1000 119/81 -- -- 79 12 93 %  06/21/24 0950 103/71 97.7 F (36.5 C) -- 87 19 95 %     Recent laboratory studies: No results for input(s): WBC, HGB, HCT, PLT, NA, K, CL, CO2, BUN, CREATININE, GLUCOSE, INR, CALCIUM in the last 72 hours.  Invalid input(s): PT, 2   Discharge Medications:   Allergies as of 06/22/2024       Reactions   Fish Allergy Hives, Swelling        Medication List     STOP taking these medications    oxyCODONE  5 MG immediate release tablet Commonly known as: Oxy IR/ROXICODONE    sulfamethoxazole -trimethoprim  800-160 MG tablet Commonly known as: BACTRIM  DS       TAKE these medications    amLODipine  2.5 MG tablet Commonly known as: NORVASC  Take 2.5 mg by mouth daily.   aspirin  81 MG chewable tablet Commonly known as: Aspirin  Childrens Chew 1 tablet (81 mg total) by mouth 2 (two) times daily. To be taken after surgery to prevent blood clots What changed: Another medication with the same name was removed. Continue taking this medication, and follow the directions you see here.   chlorhexidine  4 % external liquid Commonly known as: HIBICLENS  Apply 15 mLs (1 Application total) topically as directed for 30 doses. Use as directed daily for 5 days every other week for 6 weeks.   docusate sodium  100 MG capsule Commonly known as: Colace Take 1 capsule (100 mg total) by mouth daily as needed.   doxycycline  100 MG tablet Commonly known as: VIBRA -TABS Take 1 tablet (100 mg total) by mouth 2 (two) times daily. To be taken after surgery   Synthroid  137 MCG tablet Generic drug: levothyroxine  Take 137 mcg by mouth every morning.   levothyroxine  150 MCG tablet Commonly known as: SYNTHROID  Take 1 tablet (150 mcg total) by mouth daily before breakfast.   methocarbamol  500 MG tablet Commonly  known as: ROBAXIN  Take 1 tablet (500 mg total) by mouth 2 (two) times daily as needed. What changed: Another medication with the same name was removed. Continue taking this medication, and follow the directions you see here.   mupirocin  ointment 2 % Commonly known as: BACTROBAN  Place 1 Application into the nose 2 (two) times daily for 60 doses. Use  as directed 2 times daily for 5 days every other week for 6 weeks.   ondansetron  4 MG tablet Commonly known as: ZOFRAN  Take 1 tablet (4 mg total) by mouth every 8 (eight) hours as needed for nausea or vomiting. What changed: Another medication with the same name was removed. Continue taking this medication, and follow the directions you see here.   oxyCODONE -acetaminophen  5-325 MG tablet Commonly known as: Percocet Take 1-2 tablets by mouth every 8 (eight) hours as needed.               Durable Medical Equipment  (From admission, onward)           Start     Ordered   06/21/24 1209  DME Walker rolling  Once       Question Answer Comment  Walker: With 5 Inch Wheels   Patient needs a walker to treat with the following condition Status post left partial knee replacement      06/21/24 1208   06/21/24 1209  DME 3 n 1  Once        06/21/24 1208   06/21/24 1209  DME Bedside commode  Once       Question:  Patient needs a bedside commode to treat with the following condition  Answer:  Status post left partial knee replacement   06/21/24 1208            Diagnostic Studies: DG Knee Right Port Result Date: 06/21/2024 CLINICAL DATA:  Status post right knee arthroplasty EXAM: PORTABLE RIGHT KNEE - 2 VIEW COMPARISON:  Right knee radiograph dated 03/09/2024 FINDINGS: Postsurgical changes of right knee arthroplasty. Hardware appears intact and well seated. Overlying soft tissue edema and subcutaneous emphysema. Small volume suprapatellar effusion and gas. IMPRESSION: Postsurgical changes of right knee arthroplasty. Electronically Signed    By: Limin  Xu M.D.   On: 06/21/2024 13:57    Disposition: Discharge disposition: 01-Home or Self Care          Follow-up Information     Jule Ronal CROME, PA-C. Schedule an appointment as soon as possible for a visit in 2 week(s).   Specialty: Orthopedic Surgery Contact information: [address] [place]  [place] [address] [phone]                  Signed: Ronal CROME Jule 06/22/2024, 9:08 AM

## 2024-07-06 ENCOUNTER — Encounter: Admitting: Physician Assistant
